# Patient Record
Sex: Female | Born: 1995 | Race: White | Hispanic: Yes | Marital: Single | Smoking: Former smoker
Health system: Southern US, Community
[De-identification: ages and names within clinical notes are randomized; demographics above are authoritative.]

## PROBLEM LIST (undated history)

## (undated) ENCOUNTER — Inpatient Hospital Stay (HOSPITAL_COMMUNITY): Payer: Self-pay

## (undated) DIAGNOSIS — O09299 Supervision of pregnancy with other poor reproductive or obstetric history, unspecified trimester: Secondary | ICD-10-CM

## (undated) DIAGNOSIS — R87629 Unspecified abnormal cytological findings in specimens from vagina: Secondary | ICD-10-CM

## (undated) DIAGNOSIS — Z789 Other specified health status: Secondary | ICD-10-CM

## (undated) DIAGNOSIS — Z87440 Personal history of urinary (tract) infections: Secondary | ICD-10-CM

## (undated) DIAGNOSIS — Z8759 Personal history of other complications of pregnancy, childbirth and the puerperium: Secondary | ICD-10-CM

## (undated) HISTORY — DX: Personal history of other complications of pregnancy, childbirth and the puerperium: Z87.440

## (undated) HISTORY — PX: OTHER SURGICAL HISTORY: SHX169

## (undated) HISTORY — DX: Unspecified abnormal cytological findings in specimens from vagina: R87.629

## (undated) HISTORY — DX: Personal history of other complications of pregnancy, childbirth and the puerperium: Z87.59

## (undated) HISTORY — DX: Supervision of pregnancy with other poor reproductive or obstetric history, unspecified trimester: O09.299

---
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
## 2015-02-12 NOTE — L&D Delivery Note (Signed)
Delivery Note At 0702  a viable female was delivered via  ;  SVD.  APGAR: 8,9, ; weight 7-11.8 Placenta status:spontaneous intact Cord 3V with the following complications with a PPH.  External uterine massage, Methergine.2 Im, 800mg  cyctotec, pitocin IV  Anesthesia:  Epidural Episiotomy:  None Lacerations:  R sulcus and 2nd vaginal Suture Repair: 2.0 3.0 vicryl Est. Blood Loss (mL):  1000 cc  Mom to postpartum.  Baby to Couplet care / Skin to Skin.  Paula Simmons 11/19/2015, 8:00 AM

## 2015-02-22 ENCOUNTER — Inpatient Hospital Stay (HOSPITAL_COMMUNITY): Payer: Medicaid Other

## 2015-02-22 ENCOUNTER — Encounter (HOSPITAL_COMMUNITY): Payer: Self-pay

## 2015-02-22 ENCOUNTER — Inpatient Hospital Stay (HOSPITAL_COMMUNITY)
Admission: AD | Admit: 2015-02-22 | Discharge: 2015-02-22 | Disposition: A | Discharge status: Home / Self Care | Payer: Medicaid Other | Source: Ambulatory Visit | Attending: Family Medicine | Admitting: Family Medicine

## 2015-02-22 DIAGNOSIS — O209 Hemorrhage in early pregnancy, unspecified: Secondary | ICD-10-CM | POA: Diagnosis not present

## 2015-02-22 DIAGNOSIS — O26851 Spotting complicating pregnancy, first trimester: Secondary | ICD-10-CM | POA: Diagnosis not present

## 2015-02-22 DIAGNOSIS — Z3A01 Less than 8 weeks gestation of pregnancy: Secondary | ICD-10-CM | POA: Insufficient documentation

## 2015-02-22 DIAGNOSIS — R102 Pelvic and perineal pain: Secondary | ICD-10-CM | POA: Diagnosis not present

## 2015-02-22 DIAGNOSIS — Z87891 Personal history of nicotine dependence: Secondary | ICD-10-CM | POA: Diagnosis not present

## 2015-02-22 DIAGNOSIS — N898 Other specified noninflammatory disorders of vagina: Secondary | ICD-10-CM | POA: Diagnosis not present

## 2015-02-22 DIAGNOSIS — O200 Threatened abortion: Secondary | ICD-10-CM

## 2015-02-22 DIAGNOSIS — Z3201 Encounter for pregnancy test, result positive: Secondary | ICD-10-CM | POA: Insufficient documentation

## 2015-02-22 LAB — CBC
HCT: 37.2 % (ref 36.0–46.0)
HEMOGLOBIN: 12.5 g/dL (ref 12.0–15.0)
MCH: 29.3 pg (ref 26.0–34.0)
MCHC: 33.6 g/dL (ref 30.0–36.0)
MCV: 87.1 fL (ref 78.0–100.0)
PLATELETS: 257 10*3/uL (ref 150–400)
RBC: 4.27 MIL/uL (ref 3.87–5.11)
RDW: 13.2 % (ref 11.5–15.5)
WBC: 7.3 10*3/uL (ref 4.0–10.5)

## 2015-02-22 LAB — URINALYSIS, ROUTINE W REFLEX MICROSCOPIC
Bilirubin Urine: NEGATIVE
GLUCOSE, UA: NEGATIVE mg/dL
KETONES UR: NEGATIVE mg/dL
LEUKOCYTES UA: NEGATIVE
Nitrite: NEGATIVE
PROTEIN: NEGATIVE mg/dL
Specific Gravity, Urine: 1.01 (ref 1.005–1.030)
pH: 7 (ref 5.0–8.0)

## 2015-02-22 LAB — WET PREP, GENITAL
CLUE CELLS WET PREP: NONE SEEN
SPERM: NONE SEEN
TRICH WET PREP: NONE SEEN
Yeast Wet Prep HPF POC: NONE SEEN

## 2015-02-22 LAB — URINE MICROSCOPIC-ADD ON: Bacteria, UA: NONE SEEN

## 2015-02-22 LAB — HCG, QUANTITATIVE, PREGNANCY: hCG, Beta Chain, Quant, S: 4353 m[IU]/mL — ABNORMAL HIGH (ref <5)

## 2015-02-22 LAB — POCT PREGNANCY, URINE: PREG TEST UR: POSITIVE — AB

## 2015-02-22 LAB — ABO/RH: ABO/RH(D): O POS

## 2015-02-22 NOTE — MAU Provider Note (Signed)
History     CSN: [id number]  Arrival date and time: 02/22/15 4098   First Provider Initiated Contact with Patient 02/22/15 2014      Chief Complaint  Patient presents with  . Possible Pregnancy  . Vaginal Bleeding   Vaginal Bleeding The patient's primary symptoms include pelvic pain and vaginal bleeding. This is a new problem. The current episode started today. The problem occurs constantly. The problem has been unchanged. The pain is moderate (6/10). The problem affects both sides. She is pregnant. Associated symptoms include abdominal pain. Pertinent negatives include no constipation, diarrhea, dysuria, fever, frequency, nausea, urgency or vomiting. The vaginal discharge was bloody. The vaginal bleeding is heavier than menses. She has not been passing clots. She has not been passing tissue. Nothing aggravates the symptoms. She has tried nothing for the symptoms. Sexual activity: reports intercourse within the past 24 hours.  She uses nothing for contraception. Her menstrual history has been regular (LMP" 12/30/14.).    History reviewed. No pertinent past medical history.  Past Surgical History  Procedure Laterality Date  . Arm surgery       History reviewed. No pertinent family history.  Social History  Substance Use Topics  . Smoking status: Former Games developer  . Smokeless tobacco: Never Used  . Alcohol Use: No    Allergies: No Known Allergies  Prescriptions prior to admission  Medication Sig Dispense Refill Last Dose  . acetaminophen (TYLENOL) 325 MG tablet Take 650 mg by mouth every 6 (six) hours as needed for moderate pain.   02/21/2015 at Unknown time  . Prenatal Vit-Fe Fumarate-FA (PRENATAL MULTIVITAMIN) TABS tablet Take 1 tablet by mouth daily at 12 noon.   02/22/2015 at Unknown time    Review of Systems  Constitutional: Negative for fever.  Gastrointestinal: Positive for abdominal pain. Negative for nausea, vomiting, diarrhea and constipation.  Genitourinary: Positive  for vaginal bleeding and pelvic pain. Negative for dysuria, urgency and frequency.   Physical Exam   Blood pressure 148/94, pulse 115, temperature 98.1 F (36.7 C), temperature source Oral, resp. rate 18, height 5\' 4"  (1.626 m), weight 62.959 kg (138 lb 12.8 oz), last menstrual period 12/30/2014.  Physical Exam  Nursing note and vitals reviewed. Constitutional: She is oriented to person, place, and time. She appears well-developed and well-nourished. No distress.  HENT:  Head: Normocephalic.  Cardiovascular: Normal rate.   Respiratory: Effort normal.  GI: Soft. There is no tenderness. There is no rebound.  Genitourinary:   External: no lesion Vagina: moderate amount of blood seen Cervix: pink, smooth, no CMT Uterus: NSSC Adnexa: NT   Neurological: She is alert and oriented to person, place, and time.  Skin: Skin is warm and dry.   Results for orders placed or performed during the hospital encounter of 02/22/15 (from the past 24 hour(s))  Urinalysis, Routine w reflex microscopic (not at [facility])     Status: Abnormal   Collection Time: 02/22/15  7:06 PM  Result Value Ref Range   Color, Urine YELLOW YELLOW   APPearance CLEAR CLEAR   Specific Gravity, Urine 1.010 1.005 - 1.030   pH 7.0 5.0 - 8.0   Glucose, UA NEGATIVE NEGATIVE mg/dL   Hgb urine dipstick LARGE (A) NEGATIVE   Bilirubin Urine NEGATIVE NEGATIVE   Ketones, ur NEGATIVE NEGATIVE mg/dL   Protein, ur NEGATIVE NEGATIVE mg/dL   Nitrite NEGATIVE NEGATIVE   Leukocytes, UA NEGATIVE NEGATIVE  Urine microscopic-add on     Status: Abnormal   Collection Time: 02/22/15  7:06  PM  Result Value Ref Range   Squamous Epithelial / LPF 0-5 (A) NONE SEEN   WBC, UA 0-5 0 - 5 WBC/hpf   RBC / HPF 6-30 0 - 5 RBC/hpf   Bacteria, UA NONE SEEN NONE SEEN  Pregnancy, urine POC     Status: Abnormal   Collection Time: 02/22/15  7:11 PM  Result Value Ref Range   Preg Test, Ur POSITIVE (A) NEGATIVE  CBC     Status: None   Collection Time:  02/22/15  8:29 PM  Result Value Ref Range   WBC 7.3 4.0 - 10.5 K/uL   RBC 4.27 3.87 - 5.11 MIL/uL   Hemoglobin 12.5 12.0 - 15.0 g/dL   HCT 21.337.2 08.636.0 - 57.846.0 %   MCV 87.1 78.0 - 100.0 fL   MCH 29.3 26.0 - 34.0 pg   MCHC 33.6 30.0 - 36.0 g/dL   RDW 46.913.2 62.911.5 - 52.815.5 %   Platelets 257 150 - 400 K/uL  ABO/Rh     Status: None (Preliminary result)   Collection Time: 02/22/15  8:29 PM  Result Value Ref Range   ABO/RH(D) O POS   hCG, quantitative, pregnancy     Status: Abnormal   Collection Time: 02/22/15  8:29 PM  Result Value Ref Range   hCG, Beta Chain, Quant, S 4353 (H) <5 mIU/mL  Wet prep, genital     Status: Abnormal   Collection Time: 02/22/15  8:36 PM  Result Value Ref Range   Yeast Wet Prep HPF POC NONE SEEN NONE SEEN   Trich, Wet Prep NONE SEEN NONE SEEN   Clue Cells Wet Prep HPF POC NONE SEEN NONE SEEN   WBC, Wet Prep HPF POC MODERATE (A) NONE SEEN   Sperm NONE SEEN    [place] Ob Comp Less 14 Wks  02/22/2015  CLINICAL DATA:  20 year old female with vaginal bleeding EXAM: OBSTETRIC <14 WK US AND TRANSVAGINAL OB US TECHNIQUE: Both transabdominal and transvaginal ultrasound examinations were performed for complete evaluation of the gestation as well as the maternal uterus, adnexal regions, and pelvic cul-de-sac. Transvaginal technique was performed to assess early pregnancy. COMPARISON:  None. FINDINGS: A single intrauterine gestational sac is noted containing a yolk sac and fetal pole. The gestational sac appears to be in the lower endometrium. No cardiac activity is identified at this time. Follow-up is recommended. CRL:  5  Mm   6 w   1 d                  US EDC: 10/18/2015 Subchorionic hemorrhage:  None visualized. Maternal uterus/adnexae: The ovaries appear unremarkable. No significant free fluid identified. IMPRESSION: Single intrauterine pregnancy with gestational sac positioned in the lower endometrium. The estimated gestational age based on crown-rump length of 5 mm is 6 weeks, 1 day.  No cardiac activity identified at time. Follow-up recommended. Electronically Signed   By: Elgie CollardArash  Radparvar M.D.   On: 02/22/2015 21:21   [place] Ob Transvaginal  02/22/2015  CLINICAL DATA:  20 year old female with vaginal bleeding EXAM: OBSTETRIC <14 WK US AND TRANSVAGINAL OB US TECHNIQUE: Both transabdominal and transvaginal ultrasound examinations were performed for complete evaluation of the gestation as well as the maternal uterus, adnexal regions, and pelvic cul-de-sac. Transvaginal technique was performed to assess early pregnancy. COMPARISON:  None. FINDINGS: A single intrauterine gestational sac is noted containing a yolk sac and fetal pole. The gestational sac appears to be in the lower endometrium. No cardiac activity is identified at this time. Follow-up  is recommended. CRL:  5  Mm   6 w   1 d                  [place] EDC: 10/18/2015 Subchorionic hemorrhage:  None visualized. Maternal uterus/adnexae: The ovaries appear unremarkable. No significant free fluid identified. IMPRESSION: Single intrauterine pregnancy with gestational sac positioned in the lower endometrium. The estimated gestational age based on crown-rump length of 5 mm is 6 weeks, 1 day. No cardiac activity identified at time. Follow-up recommended. Electronically Signed   By: Elgie Collard M.D.   On: 02/22/2015 21:21    MAU Course  Procedures  MDM   Assessment and Plan   1. Threatened miscarriage in early pregnancy   2. Vaginal bleeding in pregnancy, first trimester    DC home Comfort measures reviewed  1stTrimester precautions  Bleeding precautions RX: none  FU in 48 hours, no clinic appoitnment available for Friday afternoon- will have patient come to MAU   Follow-up Information    Follow up with [facility] [place] [facility]ERN[facility] ADMISS[facility].   Why:  FRIDAY 1/13 AFTER 5:00 PM    Contact information:   [address] [id number] mc [place] [address] [phone]       Tawnya Crook 02/22/2015, 9:49 PM

## 2015-02-22 NOTE — MAU Note (Signed)
Started bleeding this afternoon.  Some light tightening, cramping in lower abd. Has not been seen yet with this preg

## 2015-02-22 NOTE — Discharge Instructions (Signed)

## 2015-02-23 LAB — GC/CHLAMYDIA PROBE AMP (~~LOC~~) NOT AT ARMC
CHLAMYDIA, DNA PROBE: NEGATIVE
Neisseria Gonorrhea: NEGATIVE

## 2015-02-23 LAB — RPR: RPR Ser Ql: NONREACTIVE

## 2015-02-23 LAB — HIV ANTIBODY (ROUTINE TESTING W REFLEX): HIV Screen 4th Generation wRfx: NONREACTIVE

## 2015-02-24 ENCOUNTER — Encounter (HOSPITAL_COMMUNITY): Payer: Self-pay | Admitting: Student

## 2015-02-24 ENCOUNTER — Inpatient Hospital Stay (HOSPITAL_COMMUNITY)
Admission: AD | Admit: 2015-02-24 | Discharge: 2015-02-24 | Disposition: A | Discharge status: Home / Self Care | Payer: Medicaid Other | Source: Ambulatory Visit | Attending: Obstetrics & Gynecology | Admitting: Obstetrics & Gynecology

## 2015-02-24 DIAGNOSIS — Z87891 Personal history of nicotine dependence: Secondary | ICD-10-CM | POA: Diagnosis not present

## 2015-02-24 DIAGNOSIS — O039 Complete or unspecified spontaneous abortion without complication: Secondary | ICD-10-CM | POA: Diagnosis not present

## 2015-02-24 DIAGNOSIS — Z09 Encounter for follow-up examination after completed treatment for conditions other than malignant neoplasm: Secondary | ICD-10-CM | POA: Diagnosis present

## 2015-02-24 HISTORY — DX: Other specified health status: Z78.9

## 2015-02-24 LAB — HCG, QUANTITATIVE, PREGNANCY: hCG, Beta Chain, Quant, S: 835 m[IU]/mL — ABNORMAL HIGH (ref <5)

## 2015-02-24 NOTE — MAU Provider Note (Signed)
History   [id number]   Chief Complaint  Patient presents with  . Follow-up    HPI Carleene OverlieLorena Thielen is a 20 y.o. female G1P0 here for follow-up BHCG.  Upon review of the records patient was first seen on 1/11 for vaginal bleeding. BHCG on that day was 4353. Ultrasound showed SIUP in lower endometrium.  GC/CT and wet prep were collected.  Results were negative. Pt discharged home with threatened miscarriage info to f/u in 48 hrs for BHCG. Today, pt denies abdominal pain; reports continued vaginal bleeding, has to change pad 3 times per day. All other systems negative.   Patient's last menstrual period was 12/30/2014.  OB History  Gravida Para Term Preterm AB SAB TAB Ectopic Multiple Living  1             # Outcome Date GA Lbr Len/2nd Weight Sex Delivery Anes PTL Lv  1 Current               History reviewed. No pertinent past medical history.  No family history on file.  Social History   Social History  . Marital Status: Single    Spouse Name: N/A  . Number of Children: N/A  . Years of Education: N/A   Social History Main Topics  . Smoking status: Former Games developermoker  . Smokeless tobacco: Never Used  . Alcohol Use: No  . Drug Use: No  . Sexual Activity: Not Currently   Other Topics Concern  . Not on file   Social History Narrative  . No narrative on file    No Known Allergies  No current facility-administered medications on file prior to encounter.   Current Outpatient Prescriptions on File Prior to Encounter  Medication Sig Dispense Refill  . acetaminophen (TYLENOL) 325 MG tablet Take 650 mg by mouth every 6 (six) hours as needed for moderate pain.    . Prenatal Vit-Fe Fumarate-FA (PRENATAL MULTIVITAMIN) TABS tablet Take 1 tablet by mouth daily at 12 noon.       Physical Exam   Filed Vitals:   02/24/15 1725  BP: 117/72  Pulse: 92  Temp: 98.1 F (36.7 C)  TempSrc: Oral  Resp: 18    Physical Exam  Constitutional: She appears well-developed and  well-nourished. No distress.  Respiratory: Effort normal. No respiratory distress.  Musculoskeletal: Normal range of motion.  Skin: She is not diaphoretic.  Psychiatric: She has a normal mood and affect. Her behavior is normal. Judgment and thought content normal.    MAU Course  Procedures Component     Latest Ref Rng 02/22/2015 02/24/2015  HCG, Beta Chain, Quant, S     <5 mIU/mL 4353 (H) 835 (H)   MDM Moderate decrease in BHCG No worsening symptoms  Assessment and Plan  19 y.o. G1P0 at 5966w3d wks Pregnancy Follow-up BHCG 1. Miscarriage     P: Discharge home Pelvic rest Discussed reasons to return to MAU Message sent to clinic for f/u in 2 weeks Comfort pack given  Judeth HornErin Sameka Bagent, NP 02/24/2015 7:02 PM

## 2015-02-24 NOTE — MAU Note (Addendum)
Judeth HornERin Lawrence NP in Triage to discuss lab results and d/c plan with pt. Pt d/c home from triage

## 2015-02-24 NOTE — MAU Note (Addendum)
Changing a pad 3 times a day, no cramping since being seen in MAU on 02/22/15.  Patient has her first appointment next Thursday with [facility], has never been there before.

## 2015-02-24 NOTE — Discharge Instructions (Signed)
Return to care   If you have heavier bleeding that soaks through more that 2 pads per hour for an hour or more  If you bleed so much that you feel like you might pass out or you do pass out  If you have significant abdominal pain that is not improved with Tylenol   If you develop a fever > 100.5      Miscarriage A miscarriage is the sudden loss of an unborn baby (fetus) before the 20th week of pregnancy. Most miscarriages happen in the first 3 months of pregnancy. Sometimes, it happens before a woman even knows she is pregnant. A miscarriage is also called a "spontaneous miscarriage" or "early pregnancy loss." Having a miscarriage can be an emotional experience. Talk with your caregiver about any questions you may have about miscarrying, the grieving process, and your future pregnancy plans. CAUSES   Problems with the fetal chromosomes that make it impossible for the baby to develop normally. Problems with the baby's genes or chromosomes are most often the result of errors that occur, by chance, as the embryo divides and grows. The problems are not inherited from the parents.  Infection of the cervix or uterus.   Hormone problems.   Problems with the cervix, such as having an incompetent cervix. This is when the tissue in the cervix is not strong enough to hold the pregnancy.   Problems with the uterus, such as an abnormally shaped uterus, uterine fibroids, or congenital abnormalities.   Certain medical conditions.   Smoking, drinking alcohol, or taking illegal drugs.   Trauma.  Often, the cause of a miscarriage is unknown.  SYMPTOMS   Vaginal bleeding or spotting, with or without cramps or pain.  Pain or cramping in the abdomen or lower back.  Passing fluid, tissue, or blood clots from the vagina. DIAGNOSIS  Your caregiver will perform a physical exam. You may also have an ultrasound to confirm the miscarriage. Blood or urine tests may also be ordered. TREATMENT    Sometimes, treatment is not necessary if you naturally pass all the fetal tissue that was in the uterus. If some of the fetus or placenta remains in the body (incomplete miscarriage), tissue left behind may become infected and must be removed. Usually, a dilation and curettage (D and C) procedure is performed. During a D and C procedure, the cervix is widened (dilated) and any remaining fetal or placental tissue is gently removed from the uterus.  Antibiotic medicines are prescribed if there is an infection. Other medicines may be given to reduce the size of the uterus (contract) if there is a lot of bleeding.  If you have Rh negative blood and your baby was Rh positive, you will need a Rh immunoglobulin shot. This shot will protect any future baby from having Rh blood problems in future pregnancies. HOME CARE INSTRUCTIONS   Your caregiver may order bed rest or may allow you to continue light activity. Resume activity as directed by your caregiver.  Have someone help with home and family responsibilities during this time.   Keep track of the number of sanitary pads you use each day and how soaked (saturated) they are. Write down this information.   Do not use tampons. Do not douche or have sexual intercourse until approved by your caregiver.   Only take over-the-counter or prescription medicines for pain or discomfort as directed by your caregiver.   Do not take aspirin. Aspirin can cause bleeding.   Keep all follow-up appointments  with your caregiver.   If you or your partner have problems with grieving, talk to your caregiver or seek counseling to help cope with the pregnancy loss. Allow enough time to grieve before trying to get pregnant again.  SEEK IMMEDIATE MEDICAL CARE IF:  6. You have severe cramps or pain in your back or abdomen. 7. You have a fever. 8. You pass large blood clots (walnut-sized or larger) ortissue from your vagina. Save any tissue for your caregiver to  inspect.  9. Your bleeding increases.  10. You have a thick, bad-smelling vaginal discharge. 11. You become lightheaded, weak, or you faint.  12. You have chills.  MAKE SURE YOU:  Understand these instructions.  Will watch your condition.  Will get help right away if you are not doing well or get worse.   This information is not intended to replace advice given to you by your health care provider. Make sure you discuss any questions you have with your health care provider.   Document Released: 07/24/2000 Document Revised: 05/25/2012 Document Reviewed: 03/19/2011 Elsevier Interactive Patient Education [employer]2016 [employer]

## 2015-03-13 ENCOUNTER — Encounter: Payer: Medicaid Other | Admitting: Obstetrics and Gynecology

## 2015-04-12 LAB — OB RESULTS CONSOLE RPR: RPR: NONREACTIVE

## 2015-04-12 LAB — OB RESULTS CONSOLE HEPATITIS B SURFACE ANTIGEN: Hepatitis B Surface Ag: NEGATIVE

## 2015-04-12 LAB — OB RESULTS CONSOLE ANTIBODY SCREEN: Antibody Screen: NEGATIVE

## 2015-04-12 LAB — OB RESULTS CONSOLE GC/CHLAMYDIA
Chlamydia: NEGATIVE
GC PROBE AMP, GENITAL: NEGATIVE

## 2015-04-12 LAB — OB RESULTS CONSOLE ABO/RH: RH Type: POSITIVE

## 2015-04-12 LAB — OB RESULTS CONSOLE RUBELLA ANTIBODY, IGM: Rubella: IMMUNE

## 2015-04-12 LAB — OB RESULTS CONSOLE HIV ANTIBODY (ROUTINE TESTING): HIV: NONREACTIVE

## 2015-05-14 ENCOUNTER — Encounter (HOSPITAL_COMMUNITY): Payer: Self-pay | Admitting: *Deleted

## 2015-05-14 ENCOUNTER — Inpatient Hospital Stay (HOSPITAL_COMMUNITY)
Admission: AD | Admit: 2015-05-14 | Discharge: 2015-05-14 | Disposition: A | Discharge status: Home / Self Care | Payer: Medicaid Other | Source: Ambulatory Visit | Attending: Obstetrics and Gynecology | Admitting: Obstetrics and Gynecology

## 2015-05-14 DIAGNOSIS — O210 Mild hyperemesis gravidarum: Secondary | ICD-10-CM | POA: Insufficient documentation

## 2015-05-14 DIAGNOSIS — Z87891 Personal history of nicotine dependence: Secondary | ICD-10-CM | POA: Insufficient documentation

## 2015-05-14 DIAGNOSIS — O99512 Diseases of the respiratory system complicating pregnancy, second trimester: Secondary | ICD-10-CM | POA: Insufficient documentation

## 2015-05-14 DIAGNOSIS — O219 Vomiting of pregnancy, unspecified: Secondary | ICD-10-CM

## 2015-05-14 DIAGNOSIS — Z3A17 17 weeks gestation of pregnancy: Secondary | ICD-10-CM | POA: Insufficient documentation

## 2015-05-14 DIAGNOSIS — M791 Myalgia: Secondary | ICD-10-CM | POA: Insufficient documentation

## 2015-05-14 DIAGNOSIS — J069 Acute upper respiratory infection, unspecified: Secondary | ICD-10-CM | POA: Insufficient documentation

## 2015-05-14 DIAGNOSIS — M7918 Myalgia, other site: Secondary | ICD-10-CM

## 2015-05-14 LAB — URINALYSIS, ROUTINE W REFLEX MICROSCOPIC
GLUCOSE, UA: NEGATIVE mg/dL
Ketones, ur: >80 mg/dL — AB
Nitrite: NEGATIVE
PROTEIN: 30 mg/dL — AB
Specific Gravity, Urine: 1.025 (ref 1.005–1.030)
pH: 6 (ref 5.0–8.0)

## 2015-05-14 LAB — URINE MICROSCOPIC-ADD ON

## 2015-05-14 LAB — INFLUENZA PANEL BY PCR (TYPE A & B)
H1N1FLUPCR: NOT DETECTED
INFLBPCR: NEGATIVE
Influenza A By PCR: NEGATIVE

## 2015-05-14 MED ORDER — METOCLOPRAMIDE HCL 10 MG PO TABS: 10.0000 mg | ORAL_TABLET | Freq: Four times a day (QID) | ORAL | Status: DC

## 2015-05-14 MED ORDER — METOCLOPRAMIDE HCL 10 MG PO TABS
10.0000 mg | ORAL_TABLET | Freq: Once | ORAL | Status: AC
  Administered 2015-05-14: 10 mg via ORAL
  Filled 2015-05-14: qty 1

## 2015-05-14 MED ORDER — LACTATED RINGERS IV BOLUS (SEPSIS)
1000.0000 mL | Freq: Once | INTRAVENOUS | Status: AC
  Administered 2015-05-14: 1000 mL via INTRAVENOUS

## 2015-05-14 MED ORDER — M.V.I. ADULT IV INJ
Freq: Once | INTRAVENOUS | Status: AC
  Administered 2015-05-14: 17:00:00 via INTRAVENOUS
  Filled 2015-05-14: qty 1000

## 2015-05-14 NOTE — Discharge Instructions (Signed)
Upper Respiratory Infection, Adult °Most upper respiratory infections (URIs) are a viral infection of the air passages leading to the lungs. A URI affects the nose, throat, and upper air passages. The most common type of URI is nasopharyngitis and is typically referred to as "the common cold." °URIs run their course and usually go away on their own. Most of the time, a URI does not require medical attention, but sometimes a bacterial infection in the upper airways can follow a viral infection. This is called a secondary infection. Sinus and middle ear infections are common types of secondary upper respiratory infections. °Bacterial pneumonia can also complicate a URI. A URI can worsen asthma and chronic obstructive pulmonary disease (COPD). Sometimes, these complications can require emergency medical care and may be life threatening.  °CAUSES °Almost all URIs are caused by viruses. A virus is a type of germ and can spread from one person to another.  °RISKS FACTORS °You may be at risk for a URI if:  °· You smoke.   °· You have chronic heart or lung disease. °· You have a weakened defense (immune) system.   °· You are very young or very old.   °· You have nasal allergies or asthma. °· You work in crowded or poorly ventilated areas. °· You work in health care facilities or schools. °SIGNS AND SYMPTOMS  °Symptoms typically develop 2-3 days after you come in contact with a cold virus. Most viral URIs last 7-10 days. However, viral URIs from the influenza virus (flu virus) can last 14-18 days and are typically more severe. Symptoms may include:  °· Runny or stuffy (congested) nose.   °· Sneezing.   °· Cough.   °· Sore throat.   °· Headache.   °· Fatigue.   °· Fever.   °· Loss of appetite.   °· Pain in your forehead, behind your eyes, and over your cheekbones (sinus pain). °· Muscle aches.   °DIAGNOSIS  °Your health care provider may diagnose a URI by: °· Physical exam. °· Tests to check that your symptoms are not due to  another condition such as: °· Strep throat. °· Sinusitis. °· Pneumonia. °· Asthma. °TREATMENT  °A URI goes away on its own with time. It cannot be cured with medicines, but medicines may be prescribed or recommended to relieve symptoms. Medicines may help: °· Reduce your fever. °· Reduce your cough. °· Relieve nasal congestion. °HOME CARE INSTRUCTIONS  °· Take medicines only as directed by your health care provider.   °· Gargle warm saltwater or take cough drops to comfort your throat as directed by your health care provider. °· Use a warm mist humidifier or inhale steam from a shower to increase air moisture. This may make it easier to breathe. °· Drink enough fluid to keep your urine clear or pale yellow.   °· Eat soups and other clear broths and maintain good nutrition.   °· Rest as needed.   °· Return to work when your temperature has returned to normal or as your health care provider advises. You may need to stay home longer to avoid infecting others. You can also use a face mask and careful hand washing to prevent spread of the virus. °· Increase the usage of your inhaler if you have asthma.   °· Do not use any tobacco products, including cigarettes, chewing tobacco, or electronic cigarettes. If you need help quitting, ask your health care provider. °PREVENTION  °The best way to protect yourself from getting a cold is to practice good hygiene.  °· Avoid oral or hand contact with people with cold   symptoms.   °· Wash your hands often if contact occurs.   °There is no clear evidence that vitamin C, vitamin E, echinacea, or exercise reduces the chance of developing a cold. However, it is always recommended to get plenty of rest, exercise, and practice good nutrition.  °SEEK MEDICAL CARE IF:  °· You are getting worse rather than better.   °· Your symptoms are not controlled by medicine.   °· You have chills. °· You have worsening shortness of breath. °· You have brown or red mucus. °· You have yellow or brown nasal  discharge. °· You have pain in your face, especially when you bend forward. °· You have a fever. °· You have swollen neck glands. °· You have pain while swallowing. °· You have white areas in the back of your throat. °SEEK IMMEDIATE MEDICAL CARE IF:  °· You have severe or persistent: °¨ Headache. °¨ Ear pain. °¨ Sinus pain. °¨ Chest pain. °· You have chronic lung disease and any of the following: °¨ Wheezing. °¨ Prolonged cough. °¨ Coughing up blood. °¨ A change in your usual mucus. °· You have a stiff neck. °· You have changes in your: °¨ Vision. °¨ Hearing. °¨ Thinking. °¨ Mood. °MAKE SURE YOU:  °· Understand these instructions. °· Will watch your condition. °· Will get help right away if you are not doing well or get worse. °  °This information is not intended to replace advice given to you by your health care provider. Make sure you discuss any questions you have with your health care provider. °  °Document Released: 07/24/2000 Document Revised: 06/14/2014 Document Reviewed: 05/05/2013 °Elsevier Interactive Patient Education ©2016 [employer] ° °Musculoskeletal Pain °Musculoskeletal pain is muscle and boney aches and pains. These pains can occur in any part of the body. Your caregiver may treat you without knowing the cause of the pain. They may treat you if blood or urine tests, X-rays, and other tests were normal.  °CAUSES °There is often not a definite cause or reason for these pains. These pains may be caused by a type of germ (virus). The discomfort may also come from overuse. Overuse includes working out too hard when your body is not fit. Boney aches also come from weather changes. Bone is sensitive to atmospheric pressure changes. °HOME CARE INSTRUCTIONS  °· Ask when your test results will be ready. Make sure you get your test results. °· Only take over-the-counter or prescription medicines for pain, discomfort, or fever as directed by your caregiver. If you were given medications for your condition,  do not drive, operate machinery or power tools, or sign legal documents for 24 hours. Do not drink alcohol. Do not take sleeping pills or other medications that may interfere with treatment. °· Continue all activities unless the activities cause more pain. When the pain lessens, slowly resume normal activities. Gradually increase the intensity and duration of the activities or exercise. °· During periods of severe pain, bed rest may be helpful. Lay or sit in any position that is comfortable. °· Putting ice on the injured area. °¨ Put ice in a bag. °¨ Place a towel between your skin and the bag. °¨ Leave the ice on for 15 to 20 minutes, 3 to 4 times a day. °· Follow up with your caregiver for continued problems and no reason can be found for the pain. If the pain becomes worse or does not go away, it may be necessary to repeat tests or do additional testing. Your caregiver may need   to look further for a possible cause. °SEEK IMMEDIATE MEDICAL CARE IF: °· You have pain that is getting worse and is not relieved by medications. °· You develop chest pain that is associated with shortness or breath, sweating, feeling sick to your stomach (nauseous), or throw up (vomit). °· Your pain becomes localized to the abdomen. °· You develop any new symptoms that seem different or that concern you. °MAKE SURE YOU:  °· Understand these instructions. °· Will watch your condition. °· Will get help right away if you are not doing well or get worse. °  °This information is not intended to replace advice given to you by your health care provider. Make sure you discuss any questions you have with your health care provider. °  °Document Released: 01/28/2005 Document Revised: 04/22/2011 Document Reviewed: 10/02/2012 °Elsevier Interactive Patient Education ©2016 [employer] ° °

## 2015-05-14 NOTE — MAU Note (Signed)
Patient presents at [redacted] weeks gestation with c/o nasal congestion for the last couple of days but is now experiencing rib pain, back pain, weakness and inability to keep anything down. Denies bleeding but states she had an increased amount of discharge last night.

## 2015-05-14 NOTE — MAU Provider Note (Signed)
Chief Complaint: Nasal Congestion; Fatigue; Chest Pain; Back Pain; Nausea; Emesis; and Vaginal Discharge   First Provider Initiated Contact with Patient 05/14/15 1622      SUBJECTIVE HPI: Paula Simmons is a 20 y.o. G1P0 at [redacted]w[redacted]d by LMP who presents to maternity admissions reporting onset of nasal congestion, h/a, fatigue, body aches, cough, and nausea/vomiting yesterday. She reports that today her ribs, chest, and upper back hurt from coughing. She has taken Tylenol which helps with headache but not with other symptoms.  She is unable to keep down much food or fluids with her vomiting.  She reports a nephew was recently diagnosed with a respiratory infection that might be the flu but was not put on any medications.  She reports a mucous-like discharge that has increased with coughing but denies vaginal itching/burning or watery leakage. She denies vaginal bleeding, urinary symptoms, dizziness, fever/chills.     HPI  Past Medical History  Diagnosis Date  . Medical history non-contributory    Past Surgical History  Procedure Laterality Date  . Arm surgery     . No past surgeries     Social History   Social History  . Marital Status: Single    Spouse Name: N/A  . Number of Children: N/A  . Years of Education: N/A   Occupational History  . Not on file.   Social History Main Topics  . Smoking status: Former Games developer  . Smokeless tobacco: Never Used  . Alcohol Use: No  . Drug Use: No  . Sexual Activity: Not Currently   Other Topics Concern  . Not on file   Social History Narrative   No current facility-administered medications on file prior to encounter.   Current Outpatient Prescriptions on File Prior to Encounter  Medication Sig Dispense Refill  . acetaminophen (TYLENOL) 325 MG tablet Take 650 mg by mouth every 6 (six) hours as needed for moderate pain.    . Prenatal Vit-Fe Fumarate-FA (PRENATAL MULTIVITAMIN) TABS tablet Take 1 tablet by mouth daily at 12 noon.      No  Known Allergies  ROS:  Review of Systems  Constitutional: Positive for fatigue. Negative for fever and chills.  HENT: Positive for sinus pressure and sore throat.   Respiratory: Positive for cough. Negative for shortness of breath.   Cardiovascular: Positive for chest pain.  Gastrointestinal: Positive for nausea and vomiting.  Genitourinary: Positive for vaginal discharge and pelvic pain. Negative for dysuria, flank pain, vaginal bleeding, difficulty urinating and vaginal pain.  Musculoskeletal: Positive for myalgias.  Neurological: Positive for headaches. Negative for dizziness.  Psychiatric/Behavioral: Negative.      I have reviewed patient's Past Medical Hx, Surgical Hx, Family Hx, Social Hx, medications and allergies.   Physical Exam   Patient Vitals for the past 24 hrs:  BP Temp Temp src Pulse Resp Height Weight  05/14/15 1917 123/70 mmHg - - 109 18 - -  05/14/15 1550 130/70 mmHg 98.4 F (36.9 C) Oral 111 18  (1.626 m) 137 lb (62.143 kg)   Constitutional: Well-developed, well-nourished female in no acute distress.  HEART: normal rate, heart sounds, regular rhythm RESP: normal effort, lung sounds clear and equal bilaterally GI: Abd soft, non-tender. Pos BS x 4 MS: Extremities nontender, no edema, normal ROM Neurologic: Alert and oriented x 4.  GU: Neg CVAT.  PELVIC EXAM: Deferred  FHT 175 by doppler  LAB RESULTS Results for orders placed or performed during the hospital encounter of 05/14/15 (from the past 24 hour(s))  Urinalysis,  Routine w reflex microscopic (not at [facility])     Status: Abnormal   Collection Time: 05/14/15  3:35 PM  Result Value Ref Range   Color, Urine YELLOW YELLOW   APPearance CLEAR CLEAR   Specific Gravity, Urine 1.025 1.005 - 1.030   pH 6.0 5.0 - 8.0   Glucose, UA NEGATIVE NEGATIVE mg/dL   Hgb urine dipstick MODERATE (A) NEGATIVE   Bilirubin Urine SMALL (A) NEGATIVE   Ketones, ur >80 (A) NEGATIVE mg/dL   Protein, ur 30 (A) NEGATIVE mg/dL    Nitrite NEGATIVE NEGATIVE   Leukocytes, UA TRACE (A) NEGATIVE  Urine microscopic-add on     Status: Abnormal   Collection Time: 05/14/15  3:35 PM  Result Value Ref Range   Squamous Epithelial / LPF 6-30 (A) NONE SEEN   WBC, UA 0-5 0 - 5 WBC/hpf   RBC / HPF 0-5 0 - 5 RBC/hpf   Bacteria, UA RARE (A) NONE SEEN   Urine-Other MUCOUS PRESENT     --/--/O POS (01/11 2029)  IMAGING No results found.  MAU Management/MDM: Ordered labs and reviewed results. Pt afebrile with known exposure to URI, possibly influenza.  She is well-appearing with normal heart and lung sounds. D5LR x 1000 ml, LR x 1000 ml, Reglan 10 mg PO x 1 dose in MAU. Pt reports improvement in symptoms and tolerated PO food/fluids in MAU.  Consult Dr Su Hiltoberts.   Pt to f/u in office as scheduled.  List of safe OTC medications given, pt encouraged to treat URI symptoms PRN.  Reglan 10 mg PO Q 6 hours Rx sent to pharmacy.  Influenza swab results pending.  Pt stable at time of discharge.  ASSESSMENT 1. Acute upper respiratory infection   2. Musculoskeletal pain   3. Nausea and vomiting during pregnancy prior to [redacted] weeks gestation     PLAN Discharge home F/U in office or return to MAU if symptoms persist or worsen Otherwise, f/u in office as scheduled  Follow-up Information    Follow up with Purcell NailsOBERTS,ANGELA Y, MD.   Specialty:  Obstetrics and Gynecology   Why:  As scheduled, Return to MAU as needed for emergencies   Contact information:   [address] [place] [id number] [phone]       Sharen CounterLisa Leftwich-Kirby Certified Nurse-Midwife 05/14/2015  8:02 PM    r

## 2015-05-25 ENCOUNTER — Encounter (HOSPITAL_COMMUNITY): Payer: Self-pay | Admitting: *Deleted

## 2015-05-25 ENCOUNTER — Inpatient Hospital Stay (HOSPITAL_COMMUNITY)
Admission: AD | Admit: 2015-05-25 | Discharge: 2015-05-26 | Disposition: A | Discharge status: Home / Self Care | Payer: Medicaid Other | Source: Ambulatory Visit | Attending: Obstetrics and Gynecology | Admitting: Obstetrics and Gynecology

## 2015-05-25 DIAGNOSIS — R05 Cough: Secondary | ICD-10-CM | POA: Diagnosis not present

## 2015-05-25 DIAGNOSIS — Z3A13 13 weeks gestation of pregnancy: Secondary | ICD-10-CM | POA: Diagnosis not present

## 2015-05-25 DIAGNOSIS — B9620 Unspecified Escherichia coli [E. coli] as the cause of diseases classified elsewhere: Secondary | ICD-10-CM | POA: Diagnosis not present

## 2015-05-25 DIAGNOSIS — O2341 Unspecified infection of urinary tract in pregnancy, first trimester: Secondary | ICD-10-CM | POA: Insufficient documentation

## 2015-05-25 DIAGNOSIS — R053 Chronic cough: Secondary | ICD-10-CM

## 2015-05-25 DIAGNOSIS — O26891 Other specified pregnancy related conditions, first trimester: Secondary | ICD-10-CM | POA: Insufficient documentation

## 2015-05-25 DIAGNOSIS — R109 Unspecified abdominal pain: Secondary | ICD-10-CM

## 2015-05-25 DIAGNOSIS — M549 Dorsalgia, unspecified: Secondary | ICD-10-CM | POA: Diagnosis present

## 2015-05-25 DIAGNOSIS — O2342 Unspecified infection of urinary tract in pregnancy, second trimester: Secondary | ICD-10-CM

## 2015-05-25 LAB — CBC
HEMATOCRIT: 34.7 % — AB (ref 36.0–46.0)
HEMOGLOBIN: 12.4 g/dL (ref 12.0–15.0)
MCH: 30 pg (ref 26.0–34.0)
MCHC: 35.7 g/dL (ref 30.0–36.0)
MCV: 83.8 fL (ref 78.0–100.0)
Platelets: 245 10*3/uL (ref 150–400)
RBC: 4.14 MIL/uL (ref 3.87–5.11)
RDW: 12.8 % (ref 11.5–15.5)
WBC: 16.6 10*3/uL — AB (ref 4.0–10.5)

## 2015-05-25 LAB — URINALYSIS, ROUTINE W REFLEX MICROSCOPIC
BILIRUBIN URINE: NEGATIVE
GLUCOSE, UA: NEGATIVE mg/dL
Nitrite: NEGATIVE
PH: 5.5 (ref 5.0–8.0)
PROTEIN: 30 mg/dL — AB
Specific Gravity, Urine: 1.025 (ref 1.005–1.030)

## 2015-05-25 LAB — URINE MICROSCOPIC-ADD ON

## 2015-05-25 MED ORDER — M.V.I. ADULT IV INJ
INJECTION | Freq: Once | INTRAVENOUS | Status: DC
  Filled 2015-05-25: qty 10

## 2015-05-25 MED ORDER — MORPHINE SULFATE (PF) 4 MG/ML IV SOLN
2.0000 mg | Freq: Once | INTRAVENOUS | Status: AC
  Administered 2015-05-25: 2 mg via INTRAVENOUS
  Filled 2015-05-25: qty 1

## 2015-05-25 MED ORDER — LACTATED RINGERS IV SOLN: INTRAVENOUS | Status: DC

## 2015-05-25 MED ORDER — LACTATED RINGERS IV BOLUS (SEPSIS)
1000.0000 mL | Freq: Once | INTRAVENOUS | Status: AC
  Administered 2015-05-25: 1000 mL via INTRAVENOUS

## 2015-05-25 MED ORDER — METOCLOPRAMIDE HCL 10 MG PO TABS
10.0000 mg | ORAL_TABLET | Freq: Once | ORAL | Status: AC
  Administered 2015-05-25: 10 mg via ORAL
  Filled 2015-05-25: qty 1

## 2015-05-25 MED ORDER — DEXTROSE 5 % IN LACTATED RINGERS IV BOLUS
1000.0000 mL | Freq: Once | INTRAVENOUS | Status: AC
  Administered 2015-05-26: 1000 mL via INTRAVENOUS

## 2015-05-25 NOTE — MAU Note (Signed)
Pt presents complaining of right side flank pain. States she thinks it's a UTI. Having pain and burning when she urinates. Complains of sweating. Denies vaginal bleeding.

## 2015-05-26 MED ORDER — MORPHINE SULFATE (PF) 4 MG/ML IV SOLN
4.0000 mg | Freq: Once | INTRAVENOUS | Status: AC
  Administered 2015-05-26: 4 mg via INTRAVENOUS
  Filled 2015-05-26: qty 1

## 2015-05-26 MED ORDER — TESSALON PERLES 100 MG PO CAPS: 100.0000 mg | ORAL_CAPSULE | Freq: Three times a day (TID) | ORAL | Status: DC | PRN

## 2015-05-26 MED ORDER — ACETAMINOPHEN 500 MG PO TABS
1000.0000 mg | ORAL_TABLET | Freq: Once | ORAL | Status: AC
  Administered 2015-05-26: 1000 mg via ORAL
  Filled 2015-05-26: qty 2

## 2015-05-26 MED ORDER — ACETAMINOPHEN 500 MG PO TABS: 1000.0000 mg | ORAL_TABLET | Freq: Four times a day (QID) | ORAL | Status: DC | PRN

## 2015-05-26 MED ORDER — HYDROMORPHONE HCL 2 MG PO TABS: 2.0000 mg | ORAL_TABLET | ORAL | Status: DC | PRN

## 2015-05-26 MED ORDER — PERCOCET 5-325 MG PO TABS: 1.0000 | ORAL_TABLET | Freq: Four times a day (QID) | ORAL | Status: DC | PRN

## 2015-05-26 NOTE — MAU Provider Note (Signed)
History  Paula Simmons is a 20 yo G2P0010 @ 13.4 wks who presents unannounced to MAU w/ c/o inability to tolerate food/fluids and right sided back pain. Was seen by Faculty on 05/14/15 and rx'd Reglan. Reports taking as needed. Thinks she also may have a UTI due to pain and burning on urination. Denies vaginal bleeding or change in vaginal discharge. Also w/ c/o ongoing cough unrelieved w/ Robitussin.    Patient Active Problem List   Diagnosis Date Noted  . UTI (urinary tract infection) in pregnancy in second trimester 05/28/2015  . Cough, persistent 05/28/2015    Chief Complaint  Patient presents with  . Back Pain   HPI  As above  OB History    Gravida Para Term Preterm AB TAB SAB Ectopic Multiple Living   0      Past Medical History  Diagnosis Date  . Medical history non-contributory     Past Surgical History  Procedure Laterality Date  . Arm surgery       History reviewed. No pertinent family history.  Social History  Substance Use Topics  . Smoking status: Former Games developer  . Smokeless tobacco: Never Used  . Alcohol Use: No    Allergies: No Known Allergies  No prescriptions prior to admission    ROS  As per HPI Physical Exam   Recent Results (from the past 2160 hour(s))  Urinalysis, Routine w reflex microscopic (not at [facility])     Status: Abnormal   Collection Time: 05/14/15  3:35 PM  Result Value Ref Range   Color, Urine YELLOW YELLOW   APPearance CLEAR CLEAR   Specific Gravity, Urine 1.025 1.005 - 1.030   pH 6.0 5.0 - 8.0   Glucose, UA NEGATIVE NEGATIVE mg/dL   Hgb urine dipstick MODERATE (A) NEGATIVE   Bilirubin Urine SMALL (A) NEGATIVE   Ketones, ur >80 (A) NEGATIVE mg/dL   Protein, ur 30 (A) NEGATIVE mg/dL   Nitrite NEGATIVE NEGATIVE   Leukocytes, UA TRACE (A) NEGATIVE  Urine microscopic-add on     Status: Abnormal   Collection Time: 05/14/15  3:35 PM  Result Value Ref Range   Squamous Epithelial / LPF 6-30 (A) NONE SEEN   WBC, UA  0-5 0 - 5 WBC/hpf   RBC / HPF 0-5 0 - 5 RBC/hpf   Bacteria, UA RARE (A) NONE SEEN   Urine-Other MUCOUS PRESENT   Influenza panel by PCR (type A & B, H1N1)     Status: None   Collection Time: 05/14/15  5:03 PM  Result Value Ref Range   Influenza A By PCR NEGATIVE NEGATIVE   Influenza B By PCR NEGATIVE NEGATIVE   H1N1 flu by pcr NOT DETECTED NOT DETECTED    Comment:        The Xpert Flu assay (FDA approved for nasal aspirates or washes and nasopharyngeal swab specimens), is intended as an aid in the diagnosis of influenza and should not be used as a sole basis for treatment. Performed at [facility]   Urinalysis, Routine w reflex microscopic (not at [facility])     Status: Abnormal   Collection Time: 05/25/15  8:56 PM  Result Value Ref Range   Color, Urine YELLOW YELLOW   APPearance CLEAR CLEAR   Specific Gravity, Urine 1.025 1.005 - 1.030   pH 5.5 5.0 - 8.0   Glucose, UA NEGATIVE NEGATIVE mg/dL   Hgb urine dipstick MODERATE (A) NEGATIVE   Bilirubin Urine  NEGATIVE NEGATIVE   Ketones, ur >80 (A) NEGATIVE mg/dL   Protein, ur 30 (A) NEGATIVE mg/dL   Nitrite NEGATIVE NEGATIVE   Leukocytes, UA TRACE (A) NEGATIVE  Urine microscopic-add on     Status: Abnormal   Collection Time: 05/25/15  8:56 PM  Result Value Ref Range   Squamous Epithelial / LPF 0-5 (A) NONE SEEN   WBC, UA 0-5 0 - 5 WBC/hpf   RBC / HPF 0-5 0 - 5 RBC/hpf   Bacteria, UA FEW (A) NONE SEEN  OB Urine Culture     Status: Abnormal   Collection Time: 05/25/15  8:56 PM  Result Value Ref Range   Specimen Description URINE, RANDOM    Special Requests NONE    Culture (A)     >=100,000 COLONIES/mL ESCHERICHIA COLI NO GROUP B STREP (S.AGALACTIAE) ISOLATED Performed at [facility]    Report Status 05/28/2015 FINAL    Organism ID, Bacteria ESCHERICHIA COLI (A)       Susceptibility   Escherichia coli - MIC*    AMPICILLIN <=2 SENSITIVE Sensitive     CEFAZOLIN <=4 SENSITIVE Sensitive     CEFTRIAXONE <=1  SENSITIVE Sensitive     CIPROFLOXACIN <=0.25 SENSITIVE Sensitive     GENTAMICIN <=1 SENSITIVE Sensitive     IMIPENEM <=0.25 SENSITIVE Sensitive     NITROFURANTOIN <=16 SENSITIVE Sensitive     TRIMETH/SULFA <=20 SENSITIVE Sensitive     AMPICILLIN/SULBACTAM <=2 SENSITIVE Sensitive     PIP/TAZO <=4 SENSITIVE Sensitive     * >=100,000 COLONIES/mL ESCHERICHIA COLI  CBC     Status: Abnormal   Collection Time: 05/25/15 11:00 PM  Result Value Ref Range   WBC 16.6 (H) 4.0 - 10.5 K/uL   RBC 4.14 3.87 - 5.11 MIL/uL   Hemoglobin 12.4 12.0 - 15.0 g/dL   HCT 16.1 (L) 09.6 - 04.5 %   MCV 83.8 78.0 - 100.0 fL   MCH 30.0 26.0 - 34.0 pg   MCHC 35.7 30.0 - 36.0 g/dL   RDW 40.9 81.1 - 91.4 %   Platelets 245 150 - 400 K/uL   Physical Exam  Gen: NAD. Lungs: CTAB. CV: RRR w/o M/R/G. Back: Neg CVAT bilaterally. Abdomen: soft, gravid, NT, no rebound or guarding. Ext: WNL. Doptones: 184 bpm.   ED Course  D5LR bolus LR bolus Reglan 10 mg po Morphine IV Tylenol 1 gram  Assessment: URI vs lower UTI vs kidney stone or combination of the three. Persistent back pain. No CVAT. Presentation, labs and history d/w Dr. Normand Sloop. Given resolution of burning w/ urination w/ fluid administration, will await urine culture in lieu of prophylactic treatment. Discussed strict precautions for return.    Plan: Plan per Dr. Normand Sloop: 1) D/C home. 2) Office f/u early next week.  3) Dilaudid 2 mg tab -- 1 by mouth q 4 hrs prn severe pain #5 (NR) & Percocet 5/325 mg tab -- 1 by mouth q 6 hrs prn severe pain #30 (NR). In addition, return to MAU sooner if no improvement, or worsening sxs. SAB precautions reviewed, OOW x 1 wk, rest/ hydration, Tessalon Perles for cough, and continue Reglan prn. Will call with results of urine culture.    Sherre Scarlet CNM, MS 05/26/15, 1:15 AM  ADDENDUM:  Results reviewed -- urine culture = >100K e.coli. Contacted pt to review results and treat. Learned from FOB that pt is  hospitalized at a local hospital due to UTI and fever. FOB made decision to not drive back to [facility]  due to distance.   Sherre ScarletKimberly Mahad Newstrom, CNM 05/28/15, 3:46 PM

## 2015-05-26 NOTE — MAU Note (Signed)
Notified provider that patient's temp is 100.8. Provider said patient could still be discharged.

## 2015-05-28 DIAGNOSIS — O2342 Unspecified infection of urinary tract in pregnancy, second trimester: Secondary | ICD-10-CM

## 2015-05-28 DIAGNOSIS — R05 Cough: Secondary | ICD-10-CM

## 2015-05-28 DIAGNOSIS — R053 Chronic cough: Secondary | ICD-10-CM

## 2015-05-28 LAB — CULTURE, OB URINE: Culture: >=100000 — AB

## 2015-10-31 LAB — OB RESULTS CONSOLE GBS: STREP GROUP B AG: NEGATIVE

## 2015-11-05 ENCOUNTER — Encounter (HOSPITAL_COMMUNITY): Payer: Self-pay | Admitting: *Deleted

## 2015-11-05 ENCOUNTER — Telehealth: Payer: Self-pay | Admitting: Nurse Practitioner

## 2015-11-05 ENCOUNTER — Inpatient Hospital Stay (HOSPITAL_COMMUNITY)
Admission: AD | Admit: 2015-11-05 | Discharge: 2015-11-05 | Disposition: A | Discharge status: Home / Self Care | Payer: Medicaid Other | Source: Ambulatory Visit | Attending: Obstetrics and Gynecology | Admitting: Obstetrics and Gynecology

## 2015-11-05 DIAGNOSIS — Z87891 Personal history of nicotine dependence: Secondary | ICD-10-CM | POA: Insufficient documentation

## 2015-11-05 DIAGNOSIS — O99713 Diseases of the skin and subcutaneous tissue complicating pregnancy, third trimester: Secondary | ICD-10-CM | POA: Insufficient documentation

## 2015-11-05 DIAGNOSIS — Z3A36 36 weeks gestation of pregnancy: Secondary | ICD-10-CM | POA: Diagnosis not present

## 2015-11-05 DIAGNOSIS — L299 Pruritus, unspecified: Secondary | ICD-10-CM | POA: Diagnosis present

## 2015-11-05 DIAGNOSIS — Z79899 Other long term (current) drug therapy: Secondary | ICD-10-CM | POA: Diagnosis not present

## 2015-11-05 DIAGNOSIS — L259 Unspecified contact dermatitis, unspecified cause: Secondary | ICD-10-CM | POA: Diagnosis not present

## 2015-11-05 LAB — URINE MICROSCOPIC-ADD ON: RBC / HPF: NONE SEEN RBC/hpf (ref 0–5)

## 2015-11-05 LAB — COMPREHENSIVE METABOLIC PANEL
ALBUMIN: 2.8 g/dL — AB (ref 3.5–5.0)
ALT: 12 U/L — AB (ref 14–54)
AST: 15 U/L (ref 15–41)
Alkaline Phosphatase: 127 U/L — ABNORMAL HIGH (ref 38–126)
Anion gap: 8 (ref 5–15)
BUN: 10 mg/dL (ref 6–20)
CHLORIDE: 104 mmol/L (ref 101–111)
CO2: 22 mmol/L (ref 22–32)
CREATININE: 0.47 mg/dL (ref 0.44–1.00)
Calcium: 9 mg/dL (ref 8.9–10.3)
GFR calc Af Amer: >60 mL/min (ref >60)
GFR calc non Af Amer: >60 mL/min (ref >60)
GLUCOSE: 95 mg/dL (ref 65–99)
Potassium: 3.8 mmol/L (ref 3.5–5.1)
SODIUM: 134 mmol/L — AB (ref 135–145)
Total Bilirubin: 0.3 mg/dL (ref 0.3–1.2)
Total Protein: 6.8 g/dL (ref 6.5–8.1)

## 2015-11-05 LAB — URINALYSIS, ROUTINE W REFLEX MICROSCOPIC
BILIRUBIN URINE: NEGATIVE
GLUCOSE, UA: NEGATIVE mg/dL
Ketones, ur: NEGATIVE mg/dL
Nitrite: NEGATIVE
Protein, ur: NEGATIVE mg/dL
SPECIFIC GRAVITY, URINE: 1.015 (ref 1.005–1.030)
pH: 6 (ref 5.0–8.0)

## 2015-11-05 MED ORDER — HYDROCORTISONE 2.5 % EX CREA: TOPICAL_CREAM | Freq: Two times a day (BID) | CUTANEOUS | 0 refills | Status: DC

## 2015-11-05 NOTE — Discharge Instructions (Signed)
Take benadryl by the package directions to help with itching.   Keep your appointment in the office this week.

## 2015-11-05 NOTE — MAU Provider Note (Signed)
History     CSN: [id number]  Arrival date and time: 11/05/15 [id number]   First Provider Initiated Contact with Patient 11/05/15 [id number]      Chief Complaint  Patient presents with  . Pruritis   HPI Paula Simmons 20 y.o. 2947w6d  Comes to MAU today with increasing amounts of itching all over her body.  Has had itching on her abdomen with stretch marks for several weeks but now is having itching on lots of areas on her body - on her inner thighs, hips, spots on legs, across knuckles, on her left wrist, back of left arm near her arm pit.  Baby is moving well and she reports no leaking or vaginal bleeding.  Has not eaten since 1 am.   OB History    Gravida Para Term Preterm AB Living   2       1 0   SAB TAB Ectopic Multiple Live Births   1              Past Medical History:  Diagnosis Date  . Medical history non-contributory     Past Surgical History:  Procedure Laterality Date  . arm surgery       History reviewed. No pertinent family history.  Social History  Substance Use Topics  . Smoking status: Former Games developermoker  . Smokeless tobacco: Never Used  . Alcohol use No    Allergies: No Known Allergies  Prescriptions Prior to Admission  Medication Sig Dispense Refill Last Dose  . HYDROmorphone (DILAUDID) 2 MG tablet Take 1 tablet (2 mg total) by mouth every 4 (four) hours as needed for severe pain. 5 tablet 0   . metoCLOPramide (REGLAN) 10 MG tablet Take 1 tablet (10 mg total) by mouth every 6 (six) hours. 30 tablet 0   . PERCOCET 5-325 MG tablet Take 1 tablet by mouth every 6 (six) hours as needed for severe pain. 30 tablet 0   . Prenatal Vit-Fe Fumarate-FA (PRENATAL MULTIVITAMIN) TABS tablet Take 1 tablet by mouth daily at 12 noon.    05/13/2015 at Unknown time  . TESSALON PERLES 100 MG capsule Take 1 capsule (100 mg total) by mouth 3 (three) times daily as needed for cough. 20 capsule 0     Review of Systems  Constitutional: Negative for fever.  Gastrointestinal: Negative for  abdominal pain, nausea and vomiting.  Genitourinary:       No vaginal discharge. No vaginal bleeding. No dysuria.  Skin: Positive for itching and rash.       No itching on palms of hands or soles of feet   Physical Exam   Blood pressure 125/80, pulse 98, temperature 97.9 F (36.6 C), resp. rate 16, last menstrual period 12/30/2014, unknown if currently breastfeeding.  Physical Exam  Nursing note and vitals reviewed. Constitutional: She is oriented to person, place, and time. She appears well-developed and well-nourished.  HENT:  Head: Normocephalic.  Eyes: EOM are normal.  Neck: Neck supple.  GI: Soft. There is no tenderness.  FHT monitor strip - Baseline 140 with moderate variability and accels of 15x15 - reactive Category I tracing.  Irregular mild contractions.  Musculoskeletal: Normal range of motion.  Neurological: She is alert and oriented to person, place, and time.  Skin: Skin is warm and dry.  Abdomen has red raised stretch marks across almost entire lower abdomen which itch.  Also has red single papule or clusters of papules in areas on her body that are itching.  Rash on knuckles, lower  wrist, back of left arm near armpit, inner thighs, on lower legs.  No papules or itching on her face.  Psychiatric: She has a normal mood and affect.    MAU Course  Procedures Results for orders placed or performed during the hospital encounter of 11/05/15 (from the past 24 hour(s))  Urinalysis, Routine w reflex microscopic (not at [facility])     Status: Abnormal   Collection Time: 11/05/15  7:20 AM  Result Value Ref Range   Color, Urine YELLOW YELLOW   APPearance CLEAR CLEAR   Specific Gravity, Urine 1.015 1.005 - 1.030   pH 6.0 5.0 - 8.0   Glucose, UA NEGATIVE NEGATIVE mg/dL   Hgb urine dipstick TRACE (A) NEGATIVE   Bilirubin Urine NEGATIVE NEGATIVE   Ketones, ur NEGATIVE NEGATIVE mg/dL   Protein, ur NEGATIVE NEGATIVE mg/dL   Nitrite NEGATIVE NEGATIVE   Leukocytes, UA SMALL (A)  NEGATIVE  Urine microscopic-add on     Status: Abnormal   Collection Time: 11/05/15  7:20 AM  Result Value Ref Range   Squamous Epithelial / LPF 6-30 (A) NONE SEEN   WBC, UA TOO NUMEROUS TO COUNT 0 - 5 WBC/hpf   RBC / HPF NONE SEEN 0 - 5 RBC/hpf   Bacteria, UA RARE (A) NONE SEEN   Urine-Other MUCOUS PRESENT   Comprehensive metabolic panel     Status: Abnormal   Collection Time: 11/05/15  8:30 AM  Result Value Ref Range   Sodium 134 (L) 135 - 145 mmol/L   Potassium 3.8 3.5 - 5.1 mmol/L   Chloride 104 101 - 111 mmol/L   CO2 22 22 - 32 mmol/L   Glucose, Bld 95 65 - 99 mg/dL   BUN 10 6 - 20 mg/dL   Creatinine, Ser 0.45 0.44 - 1.00 mg/dL   Calcium 9.0 8.9 - 40.9 mg/dL   Total Protein 6.8 6.5 - 8.1 g/dL   Albumin 2.8 (L) 3.5 - 5.0 g/dL   AST 15 15 - 41 U/L   ALT 12 (L) 14 - 54 U/L   Alkaline Phosphatase 127 (H) 38 - 126 U/L   Total Bilirubin 0.3 0.3 - 1.2 mg/dL   GFR calc non Af Amer >60 >60 mL/min   GFR calc Af Amer >60 >60 mL/min   Anion gap 8 5 - 15    MDM Consult with Dr. Sallye Ober.  Itching is moderate to severe.  While it does not have the classic symptoms of cholestasis, will draw labs to review liver function and bile acids.  Currently will treat as contact dermatitis and client will be seen in the office on Tuesday.  Assessment and Plan  Contact Dermatitis at [redacted]w[redacted]d gestation  Plan Keep appointment in the office on Tuesday or call to be seen on Monday if condition is worsening. Eprescribed hydrocortisone cream to client's pharmacy Will use OTC benadryl by the package directions to help with itching   Terri L Burleson 11/05/2015, 8:08 AM    I agree with above findings and plan.  Patient also likely with itching secondary to stretching of skin with pregnancy advancement. I note normal total bile acids, total bili and LFTs.  Patient to follow up in the office. Dr. Sallye Ober.

## 2015-11-05 NOTE — MAU Note (Signed)
Started itching on Friday on her abd. Now is all over her body.  No other complaints, did not call her md

## 2015-11-05 NOTE — Telephone Encounter (Signed)
Needs prescription sent to a different pharmacy.  First pharmacy did not get the prescription electronically.  Will resend to her new pharmacy.  Rx - hydrocortisone cream.

## 2015-11-07 LAB — BILE ACIDS, TOTAL: Bile Acids Total: 9.2 umol/L (ref 4.7–24.5)

## 2015-11-15 ENCOUNTER — Encounter (HOSPITAL_COMMUNITY): Payer: Self-pay | Admitting: *Deleted

## 2015-11-15 ENCOUNTER — Telehealth (HOSPITAL_COMMUNITY): Payer: Self-pay | Admitting: *Deleted

## 2015-11-15 NOTE — Telephone Encounter (Signed)
Preadmission screen  

## 2015-11-18 ENCOUNTER — Encounter (HOSPITAL_COMMUNITY): Payer: Self-pay | Admitting: *Deleted

## 2015-11-18 ENCOUNTER — Inpatient Hospital Stay (HOSPITAL_COMMUNITY)
Admission: AD | Admit: 2015-11-18 | Discharge: 2015-11-21 | DRG: 774 | Disposition: A | Discharge status: Home / Self Care | Payer: Medicaid Other | Source: Ambulatory Visit | Attending: Obstetrics and Gynecology | Admitting: Obstetrics and Gynecology

## 2015-11-18 ENCOUNTER — Inpatient Hospital Stay (HOSPITAL_COMMUNITY): Payer: Medicaid Other | Admitting: Anesthesiology

## 2015-11-18 DIAGNOSIS — O2686 Pruritic urticarial papules and plaques of pregnancy (PUPPP): Secondary | ICD-10-CM | POA: Diagnosis present

## 2015-11-18 DIAGNOSIS — O9081 Anemia of the puerperium: Secondary | ICD-10-CM | POA: Diagnosis not present

## 2015-11-18 DIAGNOSIS — O411230 Chorioamnionitis, third trimester, not applicable or unspecified: Secondary | ICD-10-CM | POA: Diagnosis present

## 2015-11-18 DIAGNOSIS — D62 Acute posthemorrhagic anemia: Secondary | ICD-10-CM | POA: Diagnosis not present

## 2015-11-18 DIAGNOSIS — Z3A38 38 weeks gestation of pregnancy: Secondary | ICD-10-CM

## 2015-11-18 DIAGNOSIS — L309 Dermatitis, unspecified: Secondary | ICD-10-CM | POA: Diagnosis present

## 2015-11-18 DIAGNOSIS — O1404 Mild to moderate pre-eclampsia, complicating childbirth: Principal | ICD-10-CM | POA: Diagnosis present

## 2015-11-18 DIAGNOSIS — Z87891 Personal history of nicotine dependence: Secondary | ICD-10-CM | POA: Diagnosis not present

## 2015-11-18 DIAGNOSIS — O24429 Gestational diabetes mellitus in childbirth, unspecified control: Secondary | ICD-10-CM | POA: Diagnosis present

## 2015-11-18 DIAGNOSIS — O99713 Diseases of the skin and subcutaneous tissue complicating pregnancy, third trimester: Secondary | ICD-10-CM

## 2015-11-18 DIAGNOSIS — D649 Anemia, unspecified: Secondary | ICD-10-CM

## 2015-11-18 DIAGNOSIS — O1493 Unspecified pre-eclampsia, third trimester: Secondary | ICD-10-CM | POA: Diagnosis present

## 2015-11-18 DIAGNOSIS — O1400 Mild to moderate pre-eclampsia, unspecified trimester: Secondary | ICD-10-CM | POA: Diagnosis present

## 2015-11-18 LAB — COMPREHENSIVE METABOLIC PANEL
ALBUMIN: 2.8 g/dL — AB (ref 3.5–5.0)
ALT: 13 U/L — ABNORMAL LOW (ref 14–54)
ANION GAP: 7 (ref 5–15)
AST: 19 U/L (ref 15–41)
Alkaline Phosphatase: 127 U/L — ABNORMAL HIGH (ref 38–126)
BILIRUBIN TOTAL: 0.6 mg/dL (ref 0.3–1.2)
BUN: 12 mg/dL (ref 6–20)
CHLORIDE: 108 mmol/L (ref 101–111)
CO2: 22 mmol/L (ref 22–32)
Calcium: 9 mg/dL (ref 8.9–10.3)
Creatinine, Ser: 0.52 mg/dL (ref 0.44–1.00)
GFR calc Af Amer: >60 mL/min (ref >60)
Glucose, Bld: 87 mg/dL (ref 65–99)
POTASSIUM: 3.9 mmol/L (ref 3.5–5.1)
Sodium: 137 mmol/L (ref 135–145)
TOTAL PROTEIN: 6.9 g/dL (ref 6.5–8.1)

## 2015-11-18 LAB — CBC
HCT: 32.4 % — ABNORMAL LOW (ref 36.0–46.0)
HEMATOCRIT: 32.8 % — AB (ref 36.0–46.0)
Hemoglobin: 11.3 g/dL — ABNORMAL LOW (ref 12.0–15.0)
Hemoglobin: 11 g/dL — ABNORMAL LOW (ref 12.0–15.0)
MCH: 28 pg (ref 26.0–34.0)
MCH: 27.6 pg (ref 26.0–34.0)
MCHC: 34.5 g/dL (ref 30.0–36.0)
MCHC: 34 g/dL (ref 30.0–36.0)
MCV: 81.4 fL (ref 78.0–100.0)
MCV: 81.4 fL (ref 78.0–100.0)
PLATELETS: 250 10*3/uL (ref 150–400)
PLATELETS: 235 10*3/uL (ref 150–400)
RBC: 4.03 MIL/uL (ref 3.87–5.11)
RBC: 3.98 MIL/uL (ref 3.87–5.11)
RDW: 14.2 % (ref 11.5–15.5)
RDW: 14.5 % (ref 11.5–15.5)
WBC: 11.9 10*3/uL — AB (ref 4.0–10.5)
WBC: 12.9 10*3/uL — AB (ref 4.0–10.5)

## 2015-11-18 LAB — PROTEIN / CREATININE RATIO, URINE
CREATININE, URINE: 160 mg/dL
PROTEIN CREATININE RATIO: 0.84 mg/mg{creat} — AB (ref 0.00–0.15)
Total Protein, Urine: 134 mg/dL

## 2015-11-18 LAB — POCT FERN TEST: POCT FERN TEST: NEGATIVE

## 2015-11-18 MED ORDER — OXYTOCIN 40 UNITS IN LACTATED RINGERS INFUSION - SIMPLE MED
2.5000 [IU]/h | INTRAVENOUS | Status: DC
  Administered 2015-11-19: 2.5 [IU]/h via INTRAVENOUS
  Filled 2015-11-18: qty 1000

## 2015-11-18 MED ORDER — ONDANSETRON HCL 4 MG/2ML IJ SOLN
4.0000 mg | Freq: Four times a day (QID) | INTRAMUSCULAR | Status: DC | PRN
Start: 2015-11-18 — End: 2015-11-19
  Administered 2015-11-19: 4 mg via INTRAVENOUS
  Filled 2015-11-18: qty 2

## 2015-11-18 MED ORDER — PHENYLEPHRINE 40 MCG/ML (10ML) SYRINGE FOR IV PUSH (FOR BLOOD PRESSURE SUPPORT)
80.0000 ug | PREFILLED_SYRINGE | INTRAVENOUS | Status: DC | PRN
  Filled 2015-11-18: qty 5
  Filled 2015-11-18: qty 10

## 2015-11-18 MED ORDER — EPHEDRINE 5 MG/ML INJ
10.0000 mg | INTRAVENOUS | Status: DC | PRN
  Filled 2015-11-18: qty 4

## 2015-11-18 MED ORDER — LACTATED RINGERS IV SOLN
INTRAVENOUS | Status: DC
  Administered 2015-11-18: 125 mL/h via INTRAVENOUS

## 2015-11-18 MED ORDER — PHENYLEPHRINE 40 MCG/ML (10ML) SYRINGE FOR IV PUSH (FOR BLOOD PRESSURE SUPPORT)
80.0000 ug | PREFILLED_SYRINGE | INTRAVENOUS | Status: DC | PRN
  Filled 2015-11-18: qty 5

## 2015-11-18 MED ORDER — TERBUTALINE SULFATE 1 MG/ML IJ SOLN
0.2500 mg | Freq: Once | INTRAMUSCULAR | Status: DC | PRN
  Filled 2015-11-18: qty 1

## 2015-11-18 MED ORDER — LIDOCAINE HCL (PF) 1 % IJ SOLN
INTRAMUSCULAR | Status: DC | PRN
  Administered 2015-11-18 (×2): 5 mL

## 2015-11-18 MED ORDER — FENTANYL 2.5 MCG/ML BUPIVACAINE 1/10 % EPIDURAL INFUSION (WH - ANES)
14.0000 mL/h | INTRAMUSCULAR | Status: DC | PRN
  Administered 2015-11-18 (×2): 14 mL/h via EPIDURAL
  Filled 2015-11-18 (×2): qty 125

## 2015-11-18 MED ORDER — LACTATED RINGERS IV SOLN: 500.0000 mL | Freq: Once | INTRAVENOUS | Status: DC

## 2015-11-18 MED ORDER — FENTANYL CITRATE (PF) 100 MCG/2ML IJ SOLN
50.0000 ug | INTRAMUSCULAR | Status: DC | PRN
  Administered 2015-11-18: 100 ug via INTRAVENOUS
  Filled 2015-11-18: qty 2

## 2015-11-18 MED ORDER — OXYTOCIN 40 UNITS IN LACTATED RINGERS INFUSION - SIMPLE MED
1.0000 m[IU]/min | INTRAVENOUS | Status: DC
  Administered 2015-11-18: 1 m[IU]/min via INTRAVENOUS

## 2015-11-18 MED ORDER — FENTANYL 2.5 MCG/ML BUPIVACAINE 1/10 % EPIDURAL INFUSION (WH - ANES)
14.0000 mL/h | INTRAMUSCULAR | Status: DC | PRN
  Administered 2015-11-19: 14 mL/h via EPIDURAL

## 2015-11-18 MED ORDER — LIDOCAINE HCL (PF) 1 % IJ SOLN
30.0000 mL | INTRAMUSCULAR | Status: DC | PRN
  Administered 2015-11-19: 30 mL via SUBCUTANEOUS
  Filled 2015-11-18: qty 30

## 2015-11-18 MED ORDER — SOD CITRATE-CITRIC ACID 500-334 MG/5ML PO SOLN: 30.0000 mL | ORAL | Status: DC | PRN

## 2015-11-18 MED ORDER — LACTATED RINGERS IV SOLN
500.0000 mL | INTRAVENOUS | Status: DC | PRN
Start: 2015-11-18 — End: 2015-11-19
  Administered 2015-11-19: 999 mL via INTRAVENOUS

## 2015-11-18 MED ORDER — OXYCODONE-ACETAMINOPHEN 5-325 MG PO TABS: 1.0000 | ORAL_TABLET | ORAL | Status: DC | PRN

## 2015-11-18 MED ORDER — LABETALOL HCL 5 MG/ML IV SOLN
20.0000 mg | INTRAVENOUS | Status: DC | PRN
Start: 2015-11-18 — End: 2015-11-19

## 2015-11-18 MED ORDER — OXYCODONE-ACETAMINOPHEN 5-325 MG PO TABS: 2.0000 | ORAL_TABLET | ORAL | Status: DC | PRN

## 2015-11-18 MED ORDER — FLEET ENEMA 7-19 GM/118ML RE ENEM: 1.0000 | ENEMA | RECTAL | Status: DC | PRN

## 2015-11-18 MED ORDER — HYDRALAZINE HCL 20 MG/ML IJ SOLN: 10.0000 mg | Freq: Once | INTRAMUSCULAR | Status: DC | PRN

## 2015-11-18 MED ORDER — DIPHENHYDRAMINE HCL 50 MG/ML IJ SOLN: 12.5000 mg | INTRAMUSCULAR | Status: DC | PRN

## 2015-11-18 MED ORDER — ACETAMINOPHEN 325 MG PO TABS: 650.0000 mg | ORAL_TABLET | ORAL | Status: DC | PRN

## 2015-11-18 MED ORDER — OXYTOCIN BOLUS FROM INFUSION
500.0000 mL | Freq: Once | INTRAVENOUS | Status: AC
  Administered 2015-11-19: 500 mL via INTRAVENOUS

## 2015-11-18 NOTE — Anesthesia Preprocedure Evaluation (Signed)
Anesthesia Evaluation    Airway Mallampati: II  TM Distance: >3 FB Neck ROM: Full    Dental no notable dental hx.    Pulmonary former smoker,    Pulmonary exam normal        Cardiovascular hypertension (pre-eclampsia), Normal cardiovascular exam     Neuro/Psych    GI/Hepatic   Endo/Other    Renal/GU      Musculoskeletal   Abdominal   Peds  Hematology   Anesthesia Other Findings   Reproductive/Obstetrics (+) Pregnancy                             Anesthesia Physical Anesthesia Plan  ASA: III  Anesthesia Plan: Epidural   Post-op Pain Management:    Induction:   Airway Management Planned:   Additional Equipment:   Intra-op Plan:   Post-operative Plan:   Informed Consent:   Plan Discussed with:   Anesthesia Plan Comments:         Anesthesia Quick Evaluation

## 2015-11-18 NOTE — H&P (Signed)
LABOR ADMISSION HISTORY AND PHYSICAL  Paula Simmons is a 20 y.o. female G2P0010 with IUP at [redacted]w[redacted]d  presenting for possible leaking amnitoic fluid, increased b/p's found subsequently during MAU visit. She has an elevated PCR of .8 and other preeclampsia labs are normal. Pregnancy is complicated by PUPPS.  She reports +FMs, No LOF, no VB, no blurry vision, headaches or peripheral edema, and RUQ pain.  She plans on breastfeeding.    EFW: 7.5lbs  Prenatal History/Complications:  Past Medical History: Past Medical History:  Diagnosis Date  . Hx of pyelonephritis during pregnancy   . Medical history non-contributory     Past Surgical History: Past Surgical History:  Procedure Laterality Date  . arm surgery       Obstetrical History: OB History    Gravida Para Term Preterm AB Living   2       1 0   SAB TAB Ectopic Multiple Live Births   1              Social History: Social History   Social History  . Marital status: Single    Spouse name: N/A  . Number of children: N/A  . Years of education: N/A   Social History Main Topics  . Smoking status: Former Games developer  . Smokeless tobacco: Never Used  . Alcohol use No  . Drug use: No  . Sexual activity: Not Currently    Birth control/ protection: None   Other Topics Concern  . None   Social History Narrative  . None    Family History: History reviewed. No pertinent family history.  Allergies: No Known Allergies  Prescriptions Prior to Admission  Medication Sig Dispense Refill Last Dose  . diphenhydrAMINE (BENADRYL) 25 MG tablet Take 50 mg by mouth every 6 (six) hours as needed for itching or sleep.   Past Week at Unknown time  . Prenatal Vit-Fe Fumarate-FA (PRENATAL MULTIVITAMIN) TABS tablet Take 1 tablet by mouth daily.    11/17/2015 at Unknown time     Review of Systems   All systems reviewed and negative except as stated in HPI  Blood pressure 127/85, pulse 93, temperature 98.2 F (36.8 C), resp. rate 18, last  menstrual period 12/30/2014, unknown if currently breastfeeding. General appearance:  well nourished Lungs: clear to auscultation bilaterally Heart: regular rate and rhythm Abdomen: soft, non-tender; bowel sounds normal Pelvic: wnl Dilation: 3 Effacement (%): 50 Station: -3 Presentation: Undeterminable Exam by:: Sharen Counter CNM  Extremities: Homans sign is negative, no sign of DVT DTR's 3+ Presentation: VTX Fetal monitoring- Category 1 Uterine activity - Uc's q 2 minutes Dilation: 3 Effacement (%): 50 Station: -3 Exam by:: Sharen Counter CNM   Prenatal labs: ABO, Rh: O/Positive/-- (03/01 0000) Antibody: Rubella:  RPR: Negative  HBsAg: Negative (03/01 0000)  HIV: Non-reactive (03/01 0000)  GBS: Negative Genetic screening  wnl Anatomy US wnl  Prenatal Transfer Tool  Maternal Diabetes: no Genetic Screening: wnl Maternal Ultrasounds/Referrals: wnl Fetal Ultrasounds or other Referrals:  None Maternal Substance Abuse: no Significant Maternal Medications:  Benadryl  Significant Maternal Lab Results: PCR .8 Bile acids 9.6  Results for orders placed or performed during the hospital encounter of 11/18/15 (from the past 24 hour(s))  [facility] Time: 11/18/15 11:54 AM  Result Value Ref Range   POCT Fern Test Negative = intact amniotic membranes   CBC   Collection Time: 11/18/15 12:06 PM  Result Value Ref Range   WBC 11.9 (H) 4.0 - 10.5 K/uL  RBC 4.03 3.87 - 5.11 MIL/uL   Hemoglobin 11.3 (L) 12.0 - 15.0 g/dL   HCT 19.132.8 (L) 47.836.0 - 29.546.0 %   MCV 81.4 78.0 - 100.0 fL   MCH 28.0 26.0 - 34.0 pg   MCHC 34.5 30.0 - 36.0 g/dL   RDW 62.114.2 30.811.5 - 65.715.5 %   Platelets 250 150 - 400 K/uL  Comprehensive metabolic panel   Collection Time: 11/18/15 12:06 PM  Result Value Ref Range   Sodium 137 135 - 145 mmol/L   Potassium 3.9 3.5 - 5.1 mmol/L   Chloride 108 101 - 111 mmol/L   CO2 22 22 - 32 mmol/L   Glucose, Bld 87 65 - 99 mg/dL   BUN 12 6 - 20 mg/dL    Creatinine, Ser 8.460.52 0.44 - 1.00 mg/dL   Calcium 9.0 8.9 - 96.210.3 mg/dL   Total Protein 6.9 6.5 - 8.1 g/dL   Albumin 2.8 (L) 3.5 - 5.0 g/dL   AST 19 15 - 41 U/L   ALT 13 (L) 14 - 54 U/L   Alkaline Phosphatase 127 (H) 38 - 126 U/L   Total Bilirubin 0.6 0.3 - 1.2 mg/dL   GFR calc non Af Amer >60 >60 mL/min   GFR calc Af Amer >60 >60 mL/min   Anion gap 7 5 - 15  Protein / creatinine ratio, urine   Collection Time: 11/18/15 12:15 PM  Result Value Ref Range   Creatinine, Urine 160.00 mg/dL   Total Protein, Urine 134 mg/dL   Protein Creatinine Ratio 0.84 (H) 0.00 - 0.15 mg/mg[Cre]    Patient Active Problem List   Diagnosis Date Noted  . Mild preeclampsia 11/18/2015  . Pruritic urticarial papules and plaques of pregnancy, antepartum, third trimester 11/18/2015    Assessment: Paula Simmons is a 20 y.o. G2P0010 at 455w5d here for medical induction due to preeclampsia  Plan Pitocin induction Epidural when desired Anticipate vaginal delivery.  11/18/2015, 2:50 PM

## 2015-11-18 NOTE — Progress Notes (Signed)
Subjective: Pt requesting epidural. SROM at 2135 clear fluid.    Objective: BP 134/78   Pulse 97   Temp 98.7 F (37.1 C) (Oral)   Resp 18   Ht 5\' 3"  (1.6 m)   Wt 90.3 kg (199 lb)   LMP 12/30/2014   SpO2 97%   BMI 35.25 kg/m  No intake/output data recorded. No intake/output data recorded.  FHT: Category 1 UC:   regular, every 3-4 minutes SVE:   Dilation: 4.5 Effacement (%): 80 Station: -1 Exam by:: v. menash rn Pitocin at 7mu   Assessment:  Pt is a G2P0010 at 38.5 IUP in active labor Preclampsia without severe features Plan: Epidural Anticipate NSVD Kenney HousemanNancy Jean Caterina Racine CNM, MSN 11/18/2015, 10:41 PM

## 2015-11-18 NOTE — MAU Note (Signed)
Pt presents to MAU with complaints of leakage of fluid on and off since last night. Mild, irregular contractions

## 2015-11-18 NOTE — MAU Provider Note (Signed)
Chief Complaint:  No chief complaint on file.   None     HPI: Paula Simmons is a 20 y.o. G2P0010 at [redacted]w[redacted]d who presents to maternity admissions reporting leakage of clear fluid at home starting last night and continuing this morning. She reports an episode last night of leakage of clear fluid enough to soak her underwear then she went to the bathroom and had to urinate.  Then, this morning, more clear leakage is happening intermittently enough to soak underwear but not requiring a pad. She also reports cramping/contractions that are irregular but unchanged since leakage of fluid started. She has not tried any treatments, nothing makes her symptoms better or worse, and they are unchanged since onset.  She reports one elevated BP in the office at her last visit that improved when taken a second time.  She reports mild intermittent h/a, denies epigastric pain, or visual disturbances. She reports good fetal movement, denies vaginal bleeding, vaginal itching/burning, urinary symptoms, h/a, dizziness, n/v, or fever/chills.    HPI  Past Medical History: Past Medical History:  Diagnosis Date  . Hx of pyelonephritis during pregnancy   . Medical history non-contributory     Past obstetric history: OB History  Gravida Para Term Preterm AB Living  2       1 0  SAB TAB Ectopic Multiple Live Births  1            # Outcome Date GA Lbr Len/2nd Weight Sex Delivery Anes PTL Lv  2 Current           1 SAB               Past Surgical History: Past Surgical History:  Procedure Laterality Date  . arm surgery       Family History: History reviewed. No pertinent family history.  Social History: Social History  Substance Use Topics  . Smoking status: Former Games developer  . Smokeless tobacco: Never Used  . Alcohol use No    Allergies: No Known Allergies  Meds:  Prescriptions Prior to Admission  Medication Sig Dispense Refill Last Dose  . diphenhydrAMINE (BENADRYL) 25 MG tablet Take 50 mg by mouth  every 6 (six) hours as needed for itching or sleep.   Past Week at Unknown time  . Prenatal Vit-Fe Fumarate-FA (PRENATAL MULTIVITAMIN) TABS tablet Take 1 tablet by mouth daily.    11/17/2015 at Unknown time    ROS:  Review of Systems  Constitutional: Negative for chills, fatigue and fever.  Eyes: Negative for visual disturbance.  Respiratory: Negative for shortness of breath.   Cardiovascular: Negative for chest pain.  Gastrointestinal: Positive for abdominal pain. Negative for nausea and vomiting.  Genitourinary: Positive for vaginal discharge. Negative for difficulty urinating, dysuria, flank pain, pelvic pain, vaginal bleeding and vaginal pain.  Neurological: Negative for dizziness and headaches.  Psychiatric/Behavioral: Negative.      I have reviewed patient's Past Medical Hx, Surgical Hx, Family Hx, Social Hx, medications and allergies.   Physical Exam   Patient Vitals for the past 24 hrs:  BP Temp Pulse Resp  11/18/15 1204 127/85 - 93 -  11/18/15 1149 121/95 - 91 -  11/18/15 1133 130/78 - 96 -  11/18/15 1119 127/73 - 96 -  11/18/15 1104 131/87 - 96 -  11/18/15 1049 137/90 - 96 -  11/18/15 1047 137/99 98.2 F (36.8 C) 100 18   Constitutional: Well-developed, well-nourished female in no acute distress.  Cardiovascular: normal rate Respiratory: normal effort GI: Abd soft,  non-tender, gravid appropriate for gestational age.  MS: Extremities nontender, no edema, normal ROM Neurologic: Alert and oriented x 4.  GU: Neg CVAT.  PELVIC EXAM: Negative pooling of fluid with valsalva  Dilation: 3 Effacement (%): 50 Station: -3 Presentation: Undeterminable Exam by:: Sharen CounterLisa Leftwich-Kirby CNM  FHT:  Baseline 135, moderate variability, accelerations present, no decelerations Contractions: q 3-10 mins, irregular, mild to palpation   Labs: Results for orders placed or performed during the hospital encounter of 11/18/15 (from the past 24 hour(s))  Fern Test     Status: Normal    Collection Time: 11/18/15 11:54 AM  Result Value Ref Range   POCT Fern Test Negative = intact amniotic membranes   CBC     Status: Abnormal   Collection Time: 11/18/15 12:06 PM  Result Value Ref Range   WBC 11.9 (H) 4.0 - 10.5 K/uL   RBC 4.03 3.87 - 5.11 MIL/uL   Hemoglobin 11.3 (L) 12.0 - 15.0 g/dL   HCT 82.932.8 (L) 56.236.0 - 13.046.0 %   MCV 81.4 78.0 - 100.0 fL   MCH 28.0 26.0 - 34.0 pg   MCHC 34.5 30.0 - 36.0 g/dL   RDW 86.514.2 78.411.5 - 69.615.5 %   Platelets 250 150 - 400 K/uL  Comprehensive metabolic panel     Status: Abnormal   Collection Time: 11/18/15 12:06 PM  Result Value Ref Range   Sodium 137 135 - 145 mmol/L   Potassium 3.9 3.5 - 5.1 mmol/L   Chloride 108 101 - 111 mmol/L   CO2 22 22 - 32 mmol/L   Glucose, Bld 87 65 - 99 mg/dL   BUN 12 6 - 20 mg/dL   Creatinine, Ser 2.950.52 0.44 - 1.00 mg/dL   Calcium 9.0 8.9 - 28.410.3 mg/dL   Total Protein 6.9 6.5 - 8.1 g/dL   Albumin 2.8 (L) 3.5 - 5.0 g/dL   AST 19 15 - 41 U/L   ALT 13 (L) 14 - 54 U/L   Alkaline Phosphatase 127 (H) 38 - 126 U/L   Total Bilirubin 0.6 0.3 - 1.2 mg/dL   GFR calc non Af Amer >60 >60 mL/min   GFR calc Af Amer >60 >60 mL/min   Anion gap 7 5 - 15  Protein / creatinine ratio, urine     Status: None (Preliminary result)   Collection Time: 11/18/15 12:15 PM  Result Value Ref Range   Creatinine, Urine 160.00 mg/dL   Total Protein, Urine PENDING mg/dL   Protein Creatinine Ratio PENDING 0.00 - 0.15 mg/mg[Cre]   O/Positive/-- (03/01 0000)  Imaging:  No results found.  MAU Course/MDM: I have ordered labs and reviewed results.  No evidence of ROM or active labor today.  Preeclampsia labs wnl but pt does have neurologic symptom with h/a.   Consult Dr Charlotta Newtonzan.    Pt stable at time of transfer.  Assessment: Gestational HTN at term  [facility]   Sharen CounterLisa Leftwich-Kirby Certified Nurse-Midwife 11/18/2015 1:28 PM

## 2015-11-18 NOTE — Anesthesia Pain Management Evaluation Note (Signed)
  CRNA Pain Management Visit Note  Patient: Paula Simmons, 20 y.o., female  "Hello [facility] am a [facility] the [facility]. We have an anesthesia team available at all times to provide care throughout the hospital, including epidural management and anesthesia for C-section. I don't know your plan for the delivery whether it a natural birth, water birth, IV sedation, nitrous supplementation, doula or epidural, but we want to meet your pain goals."   1.Was your pain managed to your expectations on prior hospitalizations?   No prior hospitalizations  2.What is your expectation for pain management during this hospitalization?     Epidural  3.How can we help you reach that goal? epidural  Record the patient's initial score and the patient's pain goal.   Pain: 8  Pain Goal: 8 The [facility] wants you to be able to say your pain was always managed very well.  Antonette Hendricks 11/18/2015

## 2015-11-18 NOTE — Anesthesia Procedure Notes (Signed)
Epidural Patient location during procedure: OB Start time: 11/18/2015 10:15 PM End time: 11/18/2015 10:22 PM  Staffing Anesthesiologist: Bonita QuinGUIDETTI, Hawa Henly S Performed: anesthesiologist   Preanesthetic Checklist Completed: patient identified, site marked, surgical consent, pre-op evaluation, timeout performed, IV checked, risks and benefits discussed and monitors and equipment checked  Epidural Patient position: sitting Prep: site prepped and draped and DuraPrep Patient monitoring: continuous pulse ox and blood pressure Approach: midline Location: L4-L5 Injection technique: LOR saline  Needle:  Needle type: Tuohy  Needle gauge: 17 G Needle length: 9 cm and 9 Needle insertion depth: 6 cm Catheter type: closed end flexible Catheter size: 19 Gauge Catheter at skin depth: 11 cm Test dose: negative  Assessment Events: blood not aspirated, injection not painful, no injection resistance, negative IV test and no paresthesia

## 2015-11-19 LAB — CBC
HEMATOCRIT: 29.3 % — AB (ref 36.0–46.0)
HEMOGLOBIN: 10 g/dL — AB (ref 12.0–15.0)
MCH: 28.1 pg (ref 26.0–34.0)
MCHC: 34.1 g/dL (ref 30.0–36.0)
MCV: 82.3 fL (ref 78.0–100.0)
Platelets: 260 10*3/uL (ref 150–400)
RBC: 3.56 MIL/uL — ABNORMAL LOW (ref 3.87–5.11)
RDW: 14.5 % (ref 11.5–15.5)
WBC: 19 10*3/uL — ABNORMAL HIGH (ref 4.0–10.5)

## 2015-11-19 LAB — RPR: RPR Ser Ql: NONREACTIVE

## 2015-11-19 LAB — POSTPARTUM HEMORRHAGE PROTOCOL (BB NOTIFICATION)

## 2015-11-19 MED ORDER — COCONUT OIL OIL
1.0000 "application " | TOPICAL_OIL | Status: DC | PRN
  Administered 2015-11-20: 1 via TOPICAL
  Filled 2015-11-19: qty 120

## 2015-11-19 MED ORDER — MISOPROSTOL 200 MCG PO TABS
1000.0000 ug | ORAL_TABLET | Freq: Once | ORAL | Status: AC
  Administered 2015-11-19: 1000 ug via RECTAL

## 2015-11-19 MED ORDER — ONDANSETRON HCL 4 MG PO TABS
4.0000 mg | ORAL_TABLET | ORAL | Status: DC | PRN
Start: 2015-11-19 — End: 2015-11-21

## 2015-11-19 MED ORDER — METHYLERGONOVINE MALEATE 0.2 MG/ML IJ SOLN
INTRAMUSCULAR | Status: AC
  Administered 2015-11-19: 0.2 mg
  Filled 2015-11-19: qty 1

## 2015-11-19 MED ORDER — SIMETHICONE 80 MG PO CHEW: 80.0000 mg | CHEWABLE_TABLET | ORAL | Status: DC | PRN

## 2015-11-19 MED ORDER — TETANUS-DIPHTH-ACELL PERTUSSIS 5-2.5-18.5 LF-MCG/0.5 IM SUSP
0.5000 mL | Freq: Once | INTRAMUSCULAR | Status: AC
  Administered 2015-11-21: 0.5 mL via INTRAMUSCULAR
  Filled 2015-11-19: qty 0.5

## 2015-11-19 MED ORDER — METHYLERGONOVINE MALEATE 0.2 MG PO TABS: 0.2000 mg | ORAL_TABLET | ORAL | Status: DC | PRN

## 2015-11-19 MED ORDER — METHYLERGONOVINE MALEATE 0.2 MG/ML IJ SOLN: 0.2000 mg | INTRAMUSCULAR | Status: DC | PRN

## 2015-11-19 MED ORDER — DIPHENHYDRAMINE HCL 25 MG PO TABS
50.0000 mg | ORAL_TABLET | Freq: Four times a day (QID) | ORAL | Status: DC | PRN
  Filled 2015-11-19: qty 2

## 2015-11-19 MED ORDER — ACETAMINOPHEN 500 MG PO TABS
1000.0000 mg | ORAL_TABLET | Freq: Once | ORAL | Status: AC
  Administered 2015-11-19: 1000 mg via ORAL
  Filled 2015-11-19: qty 2

## 2015-11-19 MED ORDER — PRENATAL MULTIVITAMIN CH
1.0000 | ORAL_TABLET | Freq: Every day | ORAL | Status: DC
  Administered 2015-11-20: 1 via ORAL
  Filled 2015-11-19: qty 1

## 2015-11-19 MED ORDER — DIPHENHYDRAMINE HCL 25 MG PO CAPS: 25.0000 mg | ORAL_CAPSULE | Freq: Four times a day (QID) | ORAL | Status: DC | PRN

## 2015-11-19 MED ORDER — ONDANSETRON HCL 4 MG/2ML IJ SOLN: 4.0000 mg | INTRAMUSCULAR | Status: DC | PRN

## 2015-11-19 MED ORDER — IBUPROFEN 600 MG PO TABS
600.0000 mg | ORAL_TABLET | Freq: Four times a day (QID) | ORAL | Status: DC
  Administered 2015-11-19 – 2015-11-21 (×8): 600 mg via ORAL
  Filled 2015-11-19 (×8): qty 1

## 2015-11-19 MED ORDER — ZOLPIDEM TARTRATE 5 MG PO TABS: 5.0000 mg | ORAL_TABLET | Freq: Every evening | ORAL | Status: DC | PRN

## 2015-11-19 MED ORDER — MISOPROSTOL 200 MCG PO TABS
ORAL_TABLET | ORAL | Status: AC
  Administered 2015-11-19: 1000 ug via RECTAL
  Filled 2015-11-19: qty 5

## 2015-11-19 MED ORDER — ACETAMINOPHEN 325 MG PO TABS
650.0000 mg | ORAL_TABLET | ORAL | Status: DC | PRN
Start: 2015-11-19 — End: 2015-11-21

## 2015-11-19 MED ORDER — BENZOCAINE-MENTHOL 20-0.5 % EX AERO: 1.0000 "application " | INHALATION_SPRAY | CUTANEOUS | Status: DC | PRN

## 2015-11-19 MED ORDER — LACTATED RINGERS IV BOLUS (SEPSIS)
500.0000 mL | Freq: Once | INTRAVENOUS | Status: AC
  Administered 2015-11-19: 500 mL via INTRAVENOUS

## 2015-11-19 MED ORDER — WITCH HAZEL-GLYCERIN EX PADS: 1.0000 "application " | MEDICATED_PAD | CUTANEOUS | Status: DC | PRN

## 2015-11-19 MED ORDER — DIBUCAINE 1 % RE OINT: 1.0000 "application " | TOPICAL_OINTMENT | RECTAL | Status: DC | PRN

## 2015-11-19 MED ORDER — SODIUM CHLORIDE 0.9 % IV SOLN
3.0000 g | Freq: Four times a day (QID) | INTRAVENOUS | Status: AC
  Administered 2015-11-19 – 2015-11-20 (×4): 3 g via INTRAVENOUS
  Filled 2015-11-19 (×4): qty 3

## 2015-11-19 MED ORDER — SENNOSIDES-DOCUSATE SODIUM 8.6-50 MG PO TABS
2.0000 | ORAL_TABLET | ORAL | Status: DC
  Administered 2015-11-19 – 2015-11-21 (×2): 2 via ORAL
  Filled 2015-11-19 (×2): qty 2

## 2015-11-19 NOTE — Progress Notes (Signed)
Subjective: Comfortable with epidural.  Resting.   Objective: BP 110/72   Pulse 91   Temp 99.3 F (37.4 C) (Oral)   Resp 18   Ht 5\' 3"  (1.6 m)   Wt 90.3 kg (199 lb)   LMP 12/30/2014   SpO2 97%   BMI 35.25 kg/m  No intake/output data recorded. No intake/output data recorded.  FHT: Category 1  Variable decels with contractions with quick return to baseline UC:   regular, every 3 minutes SVE:   Dilation: 5 Effacement (%): 90 Station: -1 Exam by:: a. thacker rn  Pitocin at 10   Assessment:  Pt is a G2P0010 IUP in active labor Cat 1 strip SROM clear fluid Plan: Monitor progress Anticipate NSVD  Kenney HousemanNancy Jean Prothero CNM, MSN 11/19/2015, 1:50 AM

## 2015-11-19 NOTE — Anesthesia Postprocedure Evaluation (Signed)
Anesthesia Post Note  Patient: Paula OverlieLorena Zwicker  Procedure(s) Performed: * No procedures listed *  Patient location during evaluation: Mother Baby Anesthesia Type: Epidural Level of consciousness: awake and alert Pain management: pain level controlled Vital Signs Assessment: post-procedure vital signs reviewed and stable Respiratory status: spontaneous breathing and nonlabored ventilation Cardiovascular status: stable Postop Assessment: no headache, patient able to bend at knees, no backache, no signs of nausea or vomiting, epidural receding and adequate PO intake Anesthetic complications: no     Last Vitals:  Vitals:   11/19/15 1040 11/19/15 1222  BP: 131/78 104/75  Pulse: (!) 122 (!) 123  Resp: 18 20  Temp: (!) 38.3 C 37.7 C    Last Pain:  Vitals:   11/19/15 1040  TempSrc:   PainSc: 4    Pain Goal:                 Land O'LakesMalinova,Arcangel Minion Hristova

## 2015-11-19 NOTE — Progress Notes (Signed)
   11/19/15 1224  Activity and Safety  Activity Bedrest  Activity Assistance Assistive device (dizzy on commode, used ammonia to revive)  International aid/development workerAssistive Device Stedy  Comfort and Hygiene  Hygiene Peri care  Hygiene Assistance Complete assist

## 2015-11-19 NOTE — Lactation Note (Signed)
This note was copied from a baby's chart. Lactation Consultation Note  Patient Name: Paula Carleene OverlieLorena Okane RUEAV'WToday's Date: 11/19/2015 Reason for consult: Initial assessment  Visited with first time Mom for initial assessment, baby 8 hrs old.  Baby born at 6366w6d, weighing 7#11.8.  Delivery complicated with PPH, and maternal and infant fever following. Baby's fever WNL now.  Baby has been latched X2 for 5 minutes, and X1 for 12 minutes with help from her bedside nurse.  Baby awake and alert in crib, offered to assist with placing baby skin to skin to latch to the breast.  Manual breast expression demonstrated, colostrum easy to express.  Placed baby across chest in laid back position.  Baby would not root and open her mouth.  Expressed colostrum onto nipple, but baby not interested.  Tried football hold on right breast, but unable to stimulate baby to latch.  Manually expressed small amount of colostrum into spoon.  Baby became fussy when tried to feed this to her.  Used finger to feed these few drops to baby.  Left baby skin to skin with Mom.   Baby noted to be slightly jittery.  RN aware.   Brochure left in room.  Informed Mom of IP and OP lactation services available to her.  Encouraged her to call for assistance with latch when baby begins to cue to feed.    Consult Status Consult Status: Follow-up Date: 11/20/15 Follow-up type: In-patient    Paula Simmons, Paula Simmons 11/19/2015, 3:09 PM

## 2015-11-20 LAB — PREPARE RBC (CROSSMATCH)

## 2015-11-20 LAB — CBC
HEMATOCRIT: 18.6 % — AB (ref 36.0–46.0)
Hemoglobin: 6.5 g/dL — CL (ref 12.0–15.0)
MCH: 28.5 pg (ref 26.0–34.0)
MCHC: 34.9 g/dL (ref 30.0–36.0)
MCV: 81.6 fL (ref 78.0–100.0)
PLATELETS: 181 10*3/uL (ref 150–400)
RBC: 2.28 MIL/uL — ABNORMAL LOW (ref 3.87–5.11)
RDW: 15.1 % (ref 11.5–15.5)
WBC: 13.8 10*3/uL — AB (ref 4.0–10.5)

## 2015-11-20 LAB — GLUCOSE, CAPILLARY: Glucose-Capillary: 117 mg/dL — ABNORMAL HIGH (ref 65–99)

## 2015-11-20 MED ORDER — SODIUM CHLORIDE 0.9 % IV SOLN
Freq: Once | INTRAVENOUS | Status: AC
  Administered 2015-11-20: 10 mL/h via INTRAVENOUS

## 2015-11-20 MED ORDER — FERROUS SULFATE 325 (65 FE) MG PO TABS
325.0000 mg | ORAL_TABLET | Freq: Two times a day (BID) | ORAL | Status: DC
  Administered 2015-11-20 – 2015-11-21 (×2): 325 mg via ORAL
  Filled 2015-11-20 (×2): qty 1

## 2015-11-20 MED ORDER — ACETAMINOPHEN 325 MG PO TABS
650.0000 mg | ORAL_TABLET | Freq: Once | ORAL | Status: AC
  Administered 2015-11-20: 650 mg via ORAL
  Filled 2015-11-20: qty 2

## 2015-11-20 MED ORDER — DIPHENHYDRAMINE HCL 25 MG PO CAPS
25.0000 mg | ORAL_CAPSULE | Freq: Once | ORAL | Status: AC
  Administered 2015-11-20: 25 mg via ORAL
  Filled 2015-11-20: qty 1

## 2015-11-20 NOTE — Plan of Care (Signed)
Problem: Nutritional: Goal: Dietary intake will improve Outcome: Completed/Met Date Met: 11/20/15 Tolerated regular diet with good appetite.

## 2015-11-20 NOTE — Progress Notes (Signed)
No transfusion reaction IV infusion rate is increased to 157ml/hr.

## 2015-11-20 NOTE — Progress Notes (Signed)
The tranfusion official start was @1920 , when blood started to infuse at IV site, rate 120 ml/hr. I stayed with the pt for the first 15min.   At 1935 No transfusion reaction, VS taken and recorded.

## 2015-11-20 NOTE — Progress Notes (Signed)
Post Partum Day 1 Subjective: tolerating PO and Pt c/o headache and dizziness with standing  Objective: Blood pressure 117/66, pulse (!) 109, temperature 98.3 F (36.8 C), temperature source Oral, resp. rate 18, height 5\' 3"  (1.6 m), weight 199 lb (90.3 kg), last menstrual period 12/30/2014, SpO2 100 %, unknown if currently breastfeeding.  Physical Exam:  General: alert and cooperative Lochia: appropriate Uterine Fundus: firm Incision: na DVT Evaluation: No evidence of DVT seen on physical exam. CV increase rate and rhythm Lungs CTA B  Recent Labs  11/19/15 0749 11/20/15 0539  HGB 10.0* 6.5*  HCT 29.3* 18.6*    Assessment/Plan: Plan for discharge tomorrow  Pt with symptomatic anemia will transfuse two units and place on iron.  Check CBC in the morning Preeclampsia without severe features.  [facility] are low [facility] Ch[facility]  Pt afebrile.  Will continue unasyn   LOS: 2 days   Paula Simmons A 11/20/2015, 2:03 PM

## 2015-11-20 NOTE — Progress Notes (Signed)
UR chart review completed.  

## 2015-11-20 NOTE — Lactation Note (Signed)
This note was copied from a baby's chart. Lactation Consultation Note  Patient Name: Paula Simmons ZOXWR'UToday's Date: 11/20/2015 Reason for consult: Follow-up assessment Follow up visit made.  Baby is currently feeding well in cradle hold.  Reviewed waking techniques and breast massage during feeding.  Mom states left nipple is sore.  Reviewed importance of obtaining a deep latch and holding baby close.  She will ask her nurse for coconut oil to use on nipples.  Encouraged to call with concerns/assist prn.  Maternal Data    Feeding Feeding Type: Breast Fed  LATCH Score/Interventions Latch: Grasps breast easily, tongue down, lips flanged, rhythmical sucking.  Audible Swallowing: A few with stimulation  Type of Nipple: Everted at rest and after stimulation  Comfort (Breast/Nipple): Soft / non-tender     Hold (Positioning): No assistance needed to correctly position infant at breast.  LATCH Score: 9  Lactation Tools Discussed/Used     Consult Status Consult Status: Follow-up Date: 11/21/15 Follow-up type: In-patient    Huston FoleyMOULDEN, Rashanda Magloire S 11/20/2015, 2:20 PM

## 2015-11-20 NOTE — Progress Notes (Signed)
CRITICAL VALUE ALERT  Critical value received:   Hgb = 6.5  Date of notification:  *11/20/15  Time of notification:  *0650  Critical value read back:Yes.    Nurse who received alert:  Iona Hansenmlatham Rn  MD notified (1st page):  Dr. Myna HidalgoJennifer Ozan  Time of first page:  0700  MD notified (2nd page):  Time of second page:  Responding MD: Dr. Myna HidalgoJennifer Ozan  Time MD responded:  0700

## 2015-11-20 NOTE — Plan of Care (Signed)
Problem: Urinary Elimination: Goal: Ability to reestablish a normal urinary elimination pattern will improve Outcome: Completed/Met Date Met: 11/20/15 Voiding without any difficulty.

## 2015-11-21 ENCOUNTER — Encounter: Payer: Self-pay | Admitting: Obstetrics and Gynecology

## 2015-11-21 ENCOUNTER — Inpatient Hospital Stay (HOSPITAL_COMMUNITY): Admission: RE | Admit: 2015-11-21 | Payer: Medicaid Other | Source: Ambulatory Visit

## 2015-11-21 DIAGNOSIS — D649 Anemia, unspecified: Secondary | ICD-10-CM

## 2015-11-21 LAB — CBC WITH DIFFERENTIAL/PLATELET
BASOS ABS: 0 10*3/uL (ref 0.0–0.1)
BASOS PCT: 0 %
EOS ABS: 1.4 10*3/uL — AB (ref 0.0–0.7)
Eosinophils Relative: 8 %
HCT: 28.4 % — ABNORMAL LOW (ref 36.0–46.0)
HEMOGLOBIN: 9.8 g/dL — AB (ref 12.0–15.0)
Lymphocytes Relative: 17 %
Lymphs Abs: 2.9 10*3/uL (ref 0.7–4.0)
MCH: 28.4 pg (ref 26.0–34.0)
MCHC: 34.5 g/dL (ref 30.0–36.0)
MCV: 82.3 fL (ref 78.0–100.0)
Monocytes Absolute: 1 10*3/uL (ref 0.1–1.0)
Monocytes Relative: 6 %
Neutro Abs: 12 10*3/uL — ABNORMAL HIGH (ref 1.7–7.7)
Neutrophils Relative %: 69 %
Platelets: 242 10*3/uL (ref 150–400)
RBC: 3.45 MIL/uL — AB (ref 3.87–5.11)
RDW: 15.2 % (ref 11.5–15.5)
WBC: 17.3 10*3/uL — ABNORMAL HIGH (ref 4.0–10.5)

## 2015-11-21 LAB — TYPE AND SCREEN
ABO/RH(D): O POS
Antibody Screen: NEGATIVE
Unit division: 0
Unit division: 0

## 2015-11-21 LAB — GLUCOSE, CAPILLARY: GLUCOSE-CAPILLARY: 94 mg/dL (ref 65–99)

## 2015-11-21 MED ORDER — IBUPROFEN 600 MG PO TABS: 600.0000 mg | ORAL_TABLET | Freq: Four times a day (QID) | ORAL | 2 refills | Status: DC | PRN

## 2015-11-21 MED ORDER — FERROUS SULFATE 325 (65 FE) MG PO TABS: 325.0000 mg | ORAL_TABLET | Freq: Two times a day (BID) | ORAL | 3 refills | Status: AC

## 2015-11-21 NOTE — Lactation Note (Signed)
This note was copied from a baby's chart. Lactation Consultation Note  Patient Name: Paula [name] ZOXWR'UToday's Date: 11/21/2015 Reason for consult: Follow-up assessment   P1 mom had baby on right breast when LC entered room.  Baby was latched shallow and LC assisted mom in helping baby obtain deeper latch.  Baby fell asleep so LC suggest unwrapping baby to place STS.  Baby woke and was cueing so LC assisted mom in getting baby to Left breast in football hold.  Mom reports being sore on that side; compression stripe noted and LC instructed mom to hand express EBM to nipple before and after feed.  LC observed mom doing this. Good suck swallow pattern observed during feed.  Baby was still nursing well on left side when LC left room.   LC reviewed with mom and dad OP services and BF support groups offered after DC.  Also reviewed with mom cluster feeding and engorgement care.       Maternal Data    Feeding Feeding Type: Breast Fed Length of feed:  (lc observed first 15 min of feed)  LATCH Score/Interventions Latch: Grasps breast easily, tongue down, lips flanged, rhythmical sucking. Intervention(s): Skin to skin;Waking techniques;Teach feeding cues Intervention(s): Adjust position;Assist with latch;Breast massage;Breast compression  Audible Swallowing: A few with stimulation Intervention(s): Hand expression;Skin to skin  Type of Nipple: Everted at rest and after stimulation  Comfort (Breast/Nipple): Filling, red/small blisters or bruises, mild/mod discomfort (comp. stripe on L nip. EBM to nipple)  Problem noted: Cracked, bleeding, blisters, bruises Interventions (Mild/moderate discomfort): Hand massage;Hand expression  Hold (Positioning): Assistance needed to correctly position infant at breast and maintain latch. Intervention(s): Breastfeeding basics reviewed;Support Pillows;Skin to skin;Position options (changed to football hold from cross cradle)  LATCH Score: 7  Lactation  Tools Discussed/Used     Consult Status Consult Status: Complete Date: 11/21/15 Follow-up type: In-patient    Maryruth HancockKelly Suzanne [facility] 11/21/2015, 9:31 AM

## 2015-11-21 NOTE — Discharge Summary (Signed)
[facility]   Patient Name:   Paula Simmons DOB:     Jun 13, 1995 MRN:     [id number]  Date of Admission:   11/18/2015 Date of Discharge:  11/21/2015  Admitting diagnosis:    39 WEEKS WATER BROKE Principal Problem:   Vaginal delivery Active Problems:   Postpartum hemorrhage   Second-degree perineal laceration, with delivery   Severe anemia  Term Pregnancy Delivered, Preeclampsia (mild) and GDM A2    Discharge diagnosis:    39 WEEKS WATER BROKE Principal Problem:   Vaginal delivery Active Problems:   Postpartum hemorrhage   Second-degree perineal laceration, with delivery   Severe anemia  Term Pregnancy Delivered, Preeclampsia (mild) and GDM A2, postpartum fever                                                                     Post partum procedures: blood transfusion  Type of Delivery:  SVB  Delivering Provider: Kenney HousemanPROTHERO, NANCY JEAN   Date of Delivery:  11/19/15  Newborn Data:    Live born female  Birth Weight: 7 lbs 11.8 oz (3510 g) APGARS: 8, 9  Baby's Name:              Mia Baby Feeding:   Breast Disposition:   home with mother  Complications:   Intrauterine Inflammation or infection (Chorioamniotis) and Hemorrhage>103500mL  Hospital course:      Onset of Labor With Vaginal Delivery     20 y.o. G2P0010 at 2751w6d was admitted in Latent Labor and for mild preE on 11/18/2015. Patient had a complicated labor course as follows:  Membrane Rupture Time/Date: 9:35 PM ,11/18/2015  Intrapartum Procedures: Episiotomy: None [1]                                         Lacerations:  Sulcus [9];2nd degree [3]  Patient had a delivery of a Viable infant. 11/19/2015  Information for the patient's newborn:  Paula Simmons, Girl Jessabelle [[id number]][[id number]]  Delivery Method: Vag-Spont    Patient's postpartum course was c/b Tmax of 102.2 and EBL 1000 ml. Her BPs were low in the postpartum period likely due to blood loss (was admitted due to preE w/o severe  features). She received Unasyn 3 grams q 6hrs IV x 24 hours and 2 units of PRBCS on postpartum day 1 due to symptomatic anemia (dizziness) - Hbg 6.5. Her fasting and postprandial BGs are within normal range. She is ambulating, tolerating a regular diet, passing flatus, and urinating well. She is feeling much better after blood transfusion. Post transfusion hbg is 9.8. Patient is discharged home in stable condition on 11/21/15. She is to f/u in the office in 1 week for BP check per Dr. Estanislado Pandyivard.    Physical Exam:   Vitals:   11/20/15 2200 11/20/15 2215 11/21/15 0030 11/21/15 0610  BP: 120/67 120/71 115/67 117/74  Pulse: (!) 120 (!) 112 99 (!) 102  Resp: 20 20 18 18   Temp: 97.8 F (36.6 C) 97.8 F (36.6 C) 97.9 F (36.6 C) 98.4 F (36.9 C)  TempSrc: Oral Oral Oral Oral  SpO2: 99% 100% 99%   Weight:  Height:       General: alert, cooperative and no distress Lochia: appropriate Uterine Fundus: firm Incision: Healing well with no significant drainage, No significant erythema DVT Evaluation: No evidence of DVT seen on physical exam. Negative Homan's sign. No cords or calf tenderness.  Labs: Lab Results  Component Value Date   WBC 17.3 (H) 11/21/2015   HGB 9.8 (L) 11/21/2015   HCT 28.4 (L) 11/21/2015   MCV 82.3 11/21/2015   PLT 242 11/21/2015   CMP Latest Ref Rng & Units 11/18/2015  Glucose 65 - 99 mg/dL 87  BUN 6 - 20 mg/dL 12  Creatinine 4.09 - 8.11 mg/dL 9.14  Sodium 782 - 956 mmol/L 137  Potassium 3.5 - 5.1 mmol/L 3.9  Chloride 101 - 111 mmol/L 108  CO2 22 - 32 mmol/L 22  Calcium 8.9 - 10.3 mg/dL 9.0  Total Protein 6.5 - 8.1 g/dL 6.9  Total Bilirubin 0.3 - 1.2 mg/dL 0.6  Alkaline Phos 38 - 126 U/L 127(H)  AST 15 - 41 U/L 19  ALT 14 - 54 U/L 13(L)   Prenatal labs: ABO, Rh: O/Positive/-- (03/01 0000) Antibody: Rubella:  RPR: Negative  HBsAg: Negative (03/01 0000)  HIV: Non-reactive (03/01 0000)  GBS: Negative Genetic screening  wnl Anatomy US wnl  Discharge  instruction: per After Visit Summary and "Baby and Me Booklet".  After Visit Meds:    Medication List    STOP taking these medications   diphenhydrAMINE 25 MG tablet Commonly known as:  BENADRYL     TAKE these medications   ferrous sulfate 325 (65 FE) MG tablet Take 1 tablet (325 mg total) by mouth 2 (two) times daily with a meal.   ibuprofen 600 MG tablet Commonly known as:  ADVIL,MOTRIN Take 1 tablet (600 mg total) by mouth every 6 (six) hours as needed for fever, headache, mild pain, moderate pain or cramping.   prenatal multivitamin Tabs tablet Take 1 tablet by mouth daily.       Diet: Iron rich  Activity: Advance as tolerated. Pelvic rest for 6 weeks.   Outpatient follow up: 1 week office visit for BP check  Postpartum contraception: None  11/21/2015 Sherre Scarlet, CNM

## 2015-11-21 NOTE — Addendum Note (Signed)
Addendum  created 11/21/15 1146 by Sherrian DiversBruce Dalyn Becker, MD   Anesthesia Staff edited

## 2015-11-21 NOTE — Discharge Instructions (Signed)
Contraception Choices Contraception (birth control) is the use of any methods or devices to prevent pregnancy. Below are some methods to help avoid pregnancy. HORMONAL METHODS   Contraceptive implant. This is a thin, plastic tube containing progesterone hormone. It does not contain estrogen hormone. Your health care provider inserts the tube in the inner part of the upper arm. The tube can remain in place for up to 3 years. After 3 years, the implant must be removed. The implant prevents the ovaries from releasing an egg (ovulation), thickens the cervical mucus to prevent sperm from entering the uterus, and thins the lining of the inside of the uterus.  Progesterone-only injections. These injections are given every 3 months by your health care provider to prevent pregnancy. This synthetic progesterone hormone stops the ovaries from releasing eggs. It also thickens cervical mucus and changes the uterine lining. This makes it harder for sperm to survive in the uterus.  Birth control pills. These pills contain estrogen and progesterone hormone. They work by preventing the ovaries from releasing eggs (ovulation). They also cause the cervical mucus to thicken, preventing the sperm from entering the uterus. Birth control pills are prescribed by a health care provider.Birth control pills can also be used to treat heavy periods.  Minipill. This type of birth control pill contains only the progesterone hormone. They are taken every day of each month and must be prescribed by your health care provider.  Birth control patch. The patch contains hormones similar to those in birth control pills. It must be changed once a week and is prescribed by a health care provider.  Vaginal ring. The ring contains hormones similar to those in birth control pills. It is left in the vagina for 3 weeks, removed for 1 week, and then a new one is put back in place. The patient must be comfortable inserting and removing the ring  from the vagina.A health care provider's prescription is necessary.  Emergency contraception. Emergency contraceptives prevent pregnancy after unprotected sexual intercourse. This pill can be taken right after sex or up to 5 days after unprotected sex. It is most effective the sooner you take the pills after having sexual intercourse. Most emergency contraceptive pills are available without a prescription. Check with your pharmacist. Do not use emergency contraception as your only form of birth control. BARRIER METHODS   Female condom. This is a thin sheath (latex or rubber) that is worn over the penis during sexual intercourse. It can be used with spermicide to increase effectiveness.  Female condom. This is a soft, loose-fitting sheath that is put into the vagina before sexual intercourse.  Diaphragm. This is a soft, latex, dome-shaped barrier that must be fitted by a health care provider. It is inserted into the vagina, along with a spermicidal jelly. It is inserted before intercourse. The diaphragm should be left in the vagina for 6 to 8 hours after intercourse.  Cervical cap. This is a round, soft, latex or plastic cup that fits over the cervix and must be fitted by a health care provider. The cap can be left in place for up to 48 hours after intercourse.  Sponge. This is a soft, circular piece of polyurethane foam. The sponge has spermicide in it. It is inserted into the vagina after wetting it and before sexual intercourse.  Spermicides. These are chemicals that kill or block sperm from entering the cervix and uterus. They come in the form of creams, jellies, suppositories, foam, or tablets. They do not require a  prescription. They are inserted into the vagina with an applicator before having sexual intercourse. The process must be repeated every time you have sexual intercourse. INTRAUTERINE CONTRACEPTION  Intrauterine device (IUD). This is a T-shaped device that is put in a woman's uterus  during a menstrual period to prevent pregnancy. There are 2 types:  Copper IUD. This type of IUD is wrapped in copper wire and is placed inside the uterus. Copper makes the uterus and fallopian tubes produce a fluid that kills sperm. It can stay in place for 10 years.  Hormone IUD. This type of IUD contains the hormone progestin (synthetic progesterone). The hormone thickens the cervical mucus and prevents sperm from entering the uterus, and it also thins the uterine lining to prevent implantation of a fertilized egg. The hormone can weaken or kill the sperm that get into the uterus. It can stay in place for 3-5 years, depending on which type of IUD is used. PERMANENT METHODS OF CONTRACEPTION  Female tubal ligation. This is when the woman's fallopian tubes are surgically sealed, tied, or blocked to prevent the egg from traveling to the uterus.  Hysteroscopic sterilization. This involves placing a small coil or insert into each fallopian tube. Your doctor uses a technique called hysteroscopy to do the procedure. The device causes scar tissue to form. This results in permanent blockage of the fallopian tubes, so the sperm cannot fertilize the egg. It takes about 3 months after the procedure for the tubes to become blocked. You must use another form of birth control for these 3 months.  Female sterilization. This is when the female has the tubes that carry sperm tied off (vasectomy).This blocks sperm from entering the vagina during sexual intercourse. After the procedure, the man can still ejaculate fluid (semen). NATURAL PLANNING METHODS  Natural family planning. This is not having sexual intercourse or using a barrier method (condom, diaphragm, cervical cap) on days the woman could become pregnant.  Calendar method. This is keeping track of the length of each menstrual cycle and identifying when you are fertile.  Ovulation method. This is avoiding sexual intercourse during ovulation.  Symptothermal  method. This is avoiding sexual intercourse during ovulation, using a thermometer and ovulation symptoms.  Post-ovulation method. This is timing sexual intercourse after you have ovulated. Regardless of which type or method of contraception you choose, it is important that you use condoms to protect against the transmission of sexually transmitted infections (STIs). Talk with your health care provider about which form of contraception is most appropriate for you.   This information is not intended to replace advice given to you by your health care provider. Make sure you discuss any questions you have with your health care provider.   Document Released: 01/28/2005 Document Revised: 02/02/2013 Document Reviewed: 07/23/2012 Elsevier Interactive Patient Education 2016 [employer] Breastfeeding and Mastitis Mastitis is inflammation of the breast tissue. It can occur in women who are breastfeeding. This can make breastfeeding painful. Mastitis will sometimes go away on its own. Your health care provider will help determine if treatment is needed. CAUSES Mastitis is often associated with a blocked milk (lactiferous) duct. This can happen when too much milk builds up in the breast. Causes of excess milk in the breast can include:  Poor latch-on. If your baby is not latched onto the breast properly, she or he may not empty your breast completely while breastfeeding.  Allowing too much time to pass between feedings.  Wearing a bra or other clothing that is  too tight. This puts extra pressure on the lactiferous ducts so milk does not flow through them as it should. Mastitis can also be caused by a bacterial infection. Bacteria may enter the breast tissue through cuts or openings in the skin. In women who are breastfeeding, this may occur because of cracked or irritated skin. Cracks in the skin are often caused when your baby does not latch on properly to the breast. SIGNS AND SYMPTOMS  Swelling,  redness, tenderness, and pain in an area of the breast.  Swelling of the glands under the arm on the same side.  Fever may or may not accompany mastitis. If an infection is allowed to progress, a collection of pus (abscess) may develop. DIAGNOSIS  Your health care provider can usually diagnose mastitis based on your symptoms and a physical exam. Tests may be done to help confirm the diagnosis. These may include:  Removal of pus from the breast by applying pressure to the area. This pus can be examined in the lab to determine which bacteria are present. If an abscess has developed, the fluid in the abscess can be removed with a needle. This can also be used to confirm the diagnosis and determine the bacteria present. In most cases, pus will not be present.  Blood tests to determine if your body is fighting a bacterial infection.  Mammogram or ultrasound tests to rule out other problems or diseases. TREATMENT  Mastitis that occurs with breastfeeding will sometimes go away on its own. Your health care provider may choose to wait 24 hours after first seeing you to decide whether a prescription medicine is needed. If your symptoms are worse after 24 hours, your health care provider will likely prescribe an antibiotic medicine to treat the mastitis. He or she will determine which bacteria are most likely causing the infection and will then select an appropriate antibiotic medicine. This is sometimes changed based on the results of tests performed to identify the bacteria, or if there is no response to the antibiotic medicine selected. Antibiotic medicines are usually given by mouth. You may also be given medicine for pain. HOME CARE INSTRUCTIONS  Only take over-the-counter or prescription medicines for pain, fever, or discomfort as directed by your health care provider.  If your health care provider prescribed an antibiotic medicine, take the medicine as directed. Make sure you finish it even if you  start to feel better.  Do not wear a tight or underwire bra. Wear a soft, supportive bra.  Increase your fluid intake, especially if you have a fever.  Continue to empty the breast. Your health care provider can tell you whether this milk is safe for your infant or needs to be thrown out. You may be told to stop nursing until your health care provider thinks it is safe for your baby. Use a breast pump if you are advised to stop nursing.  Keep your nipples clean and dry.  Empty the first breast completely before going to the other breast. If your baby is not emptying your breasts completely for some reason, use a breast pump to empty your breasts.  If you go back to work, pump your breasts while at work to stay in time with your nursing schedule.  Avoid allowing your breasts to become overly filled with milk (engorged). SEEK MEDICAL CARE IF:  You have pus-like discharge from the breast.  Your symptoms do not improve with the treatment prescribed by your health care provider within 2 days. SEEK IMMEDIATE  MEDICAL CARE IF:  Your pain and swelling are getting worse.  You have pain that is not controlled with medicine.  You have a red line extending from the breast toward your armpit.  You have a fever or persistent symptoms for more than 2-3 days.  You have a fever and your symptoms suddenly get worse. MAKE SURE YOU:   Understand these instructions.  Will watch your condition.  Will get help right away if you are not doing well or get worse.   This information is not intended to replace advice given to you by your health care provider. Make sure you discuss any questions you have with your health care provider.   Document Released: 05/25/2004 Document Revised: 02/02/2013 Document Reviewed: 09/03/2012 Elsevier Interactive Patient Education [employer]. Breastfeeding Deciding to breastfeed is one of the best choices you can make for you and your baby. A change in hormones  during pregnancy causes your breast tissue to grow and increases the number and size of your milk ducts. These hormones also allow proteins, sugars, and fats from your blood supply to make breast milk in your milk-producing glands. Hormones prevent breast milk from being released before your baby is born as well as prompt milk flow after birth. Once breastfeeding has begun, thoughts of your baby, as well as his or her sucking or crying, can stimulate the release of milk from your milk-producing glands.  BENEFITS OF BREASTFEEDING For Your Baby  Your first milk (colostrum) helps your baby's digestive system function better.  There are antibodies in your milk that help your baby fight off infections.  Your baby has a lower incidence of asthma, allergies, and sudden infant death syndrome.  The nutrients in breast milk are better for your baby than infant formulas and are designed uniquely for your baby's needs.  Breast milk improves your baby's brain development.  Your baby is less likely to develop other conditions, such as childhood obesity, asthma, or type 2 diabetes mellitus. For You  Breastfeeding helps to create a very special bond between you and your baby.  Breastfeeding is convenient. Breast milk is always available at the correct temperature and costs nothing.  Breastfeeding helps to burn calories and helps you lose the weight gained during pregnancy.  Breastfeeding makes your uterus contract to its prepregnancy size faster and slows bleeding (lochia) after you give birth.   Breastfeeding helps to lower your risk of developing type 2 diabetes mellitus, osteoporosis, and breast or ovarian cancer later in life. SIGNS THAT YOUR BABY IS HUNGRY Early Signs of Hunger  Increased alertness or activity.  Stretching.  Movement of the head from side to side.  Movement of the head and opening of the mouth when the corner of the mouth or cheek is stroked (rooting).  Increased sucking  sounds, smacking lips, cooing, sighing, or squeaking.  Hand-to-mouth movements.  Increased sucking of fingers or hands. Late Signs of Hunger  Fussing.  Intermittent crying. Extreme Signs of Hunger Signs of extreme hunger will require calming and consoling before your baby will be able to breastfeed successfully. Do not wait for the following signs of extreme hunger to occur before you initiate breastfeeding:  Restlessness.  A loud, strong cry.  Screaming. BREASTFEEDING BASICS Breastfeeding Initiation  Find a comfortable place to sit or lie down, with your neck and back well supported.  Place a pillow or rolled up blanket under your baby to bring him or her to the level of your breast (if you are  seated). Nursing pillows are specially designed to help support your arms and your baby while you breastfeed.  Make sure that your baby's abdomen is facing your abdomen.  Gently massage your breast. With your fingertips, massage from your chest wall toward your nipple in a circular motion. This encourages milk flow. You may need to continue this action during the feeding if your milk flows slowly.  Support your breast with 4 fingers underneath and your thumb above your nipple. Make sure your fingers are well away from your nipple and your baby's mouth.  Stroke your baby's lips gently with your finger or nipple.  When your baby's mouth is open wide enough, quickly bring your baby to your breast, placing your entire nipple and as much of the colored area around your nipple (areola) as possible into your baby's mouth.  More areola should be visible above your baby's upper lip than below the lower lip.  Your baby's tongue should be between his or her lower gum and your breast.  Ensure that your baby's mouth is correctly positioned around your nipple (latched). Your baby's lips should create a seal on your breast and be turned out (everted).  It is common for your baby to suck about 2-3  minutes in order to start the flow of breast milk. Latching Teaching your baby how to latch on to your breast properly is very important. An improper latch can cause nipple pain and decreased milk supply for you and poor weight gain in your baby. Also, if your baby is not latched onto your nipple properly, he or she may swallow some air during feeding. This can make your baby fussy. Burping your baby when you switch breasts during the feeding can help to get rid of the air. However, teaching your baby to latch on properly is still the best way to prevent fussiness from swallowing air while breastfeeding. Signs that your baby has successfully latched on to your nipple:  Silent tugging or silent sucking, without causing you pain.  Swallowing heard between every 3-4 sucks.  Muscle movement above and in front of his or her ears while sucking. Signs that your baby has not successfully latched on to nipple:  Sucking sounds or smacking sounds from your baby while breastfeeding.  Nipple pain. If you think your baby has not latched on correctly, slip your finger into the corner of your baby's mouth to break the suction and place it between your baby's gums. Attempt breastfeeding initiation again. Signs of Successful Breastfeeding Signs from your baby:  A gradual decrease in the number of sucks or complete cessation of sucking.  Falling asleep.  Relaxation of his or her body.  Retention of a small amount of milk in his or her mouth.  Letting go of your breast by himself or herself. Signs from you:  Breasts that have increased in firmness, weight, and size 1-3 hours after feeding.  Breasts that are softer immediately after breastfeeding.  Increased milk volume, as well as a change in milk consistency and color by the fifth day of breastfeeding.  Nipples that are not sore, cracked, or bleeding. Signs That Your [employer] is Getting Enough Milk  Wetting at least 3 diapers in a 24-hour period. The  urine should be clear and pale yellow by age 40 days.  At least 3 stools in a 24-hour period by age 40 days. The stool should be soft and yellow.  At least 3 stools in a 24-hour period by age 23 days. The  stool should be seedy and yellow.  No loss of weight greater than 10% of birth weight during the first 34 days of age.  Average weight gain of 4-7 ounces (113-198 g) per week after age 18 days.  Consistent daily weight gain by age 7 days, without weight loss after the age of 2 weeks. After a feeding, your baby may spit up a small amount. This is common. BREASTFEEDING FREQUENCY AND DURATION Frequent feeding will help you make more milk and can prevent sore nipples and breast engorgement. Breastfeed when you feel the need to reduce the fullness of your breasts or when your baby shows signs of hunger. This is called "breastfeeding on demand." Avoid introducing a pacifier to your baby while you are working to establish breastfeeding (the first 4-6 weeks after your baby is born). After this time you may choose to use a pacifier. Research has shown that pacifier use during the first year of a baby's life decreases the risk of sudden infant death syndrome (SIDS). Allow your baby to feed on each breast as long as he or she wants. Breastfeed until your baby is finished feeding. When your baby unlatches or falls asleep while feeding from the first breast, offer the second breast. Because newborns are often sleepy in the first few weeks of life, you may need to awaken your baby to get him or her to feed. Breastfeeding times will vary from baby to baby. However, the following rules can serve as a guide to help you ensure that your baby is properly fed:  Newborns (babies 11 weeks of age or younger) may breastfeed every 1-3 hours.  Newborns should not go longer than 3 hours during the day or 5 hours during the night without breastfeeding.  You should breastfeed your baby a minimum of 8 times in a 24-hour period  until you begin to introduce solid foods to your baby at around 37 months of age. BREAST MILK PUMPING Pumping and storing breast milk allows you to ensure that your baby is exclusively fed your breast milk, even at times when you are unable to breastfeed. This is especially important if you are going back to work while you are still breastfeeding or when you are not able to be present during feedings. Your lactation consultant can give you guidelines on how long it is safe to store breast milk. A breast pump is a machine that allows you to pump milk from your breast into a sterile bottle. The pumped breast milk can then be stored in a refrigerator or freezer. Some breast pumps are operated by hand, while others use electricity. Ask your lactation consultant which type will work best for you. Breast pumps can be purchased, but some hospitals and breastfeeding support groups lease breast pumps on a monthly basis. A lactation consultant can teach you how to hand express breast milk, if you prefer not to use a pump. CARING FOR YOUR BREASTS WHILE YOU BREASTFEED Nipples can become dry, cracked, and sore while breastfeeding. The following recommendations can help keep your breasts moisturized and healthy:  Avoid using soap on your nipples.  Wear a supportive bra. Although not required, special nursing bras and tank tops are designed to allow access to your breasts for breastfeeding without taking off your entire bra or top. Avoid wearing underwire-style bras or extremely tight bras.  Air dry your nipples for 3-43mnutes after each feeding.  Use only cotton bra pads to absorb leaked breast milk. Leaking of breast milk between feedings  is normal.  Use lanolin on your nipples after breastfeeding. Lanolin helps to maintain your skin's normal moisture barrier. If you use pure lanolin, you do not need to wash it off before feeding your baby again. Pure lanolin is not toxic to your baby. You may also hand express a  few drops of breast milk and gently massage that milk into your nipples and allow the milk to air dry. In the first few weeks after giving birth, some women experience extremely full breasts (engorgement). Engorgement can make your breasts feel heavy, warm, and tender to the touch. Engorgement peaks within 3-5 days after you give birth. The following recommendations can help ease engorgement:  Completely empty your breasts while breastfeeding or pumping. You may want to start by applying warm, moist heat (in the shower or with warm water-soaked hand towels) just before feeding or pumping. This increases circulation and helps the milk flow. If your baby does not completely empty your breasts while breastfeeding, pump any extra milk after he or she is finished.  Wear a snug bra (nursing or regular) or tank top for 1-2 days to signal your body to slightly decrease milk production.  Apply ice packs to your breasts, unless this is too uncomfortable for you.  Make sure that your baby is latched on and positioned properly while breastfeeding. If engorgement persists after 48 hours of following these recommendations, contact your health care provider or a Science writer. OVERALL HEALTH CARE RECOMMENDATIONS WHILE BREASTFEEDING  Eat healthy foods. Alternate between meals and snacks, eating 3 of each per day. Because what you eat affects your breast milk, some of the foods may make your baby more irritable than usual. Avoid eating these foods if you are sure that they are negatively affecting your baby.  Drink milk, fruit juice, and water to satisfy your thirst (about 10 glasses a day).  Rest often, relax, and continue to take your prenatal vitamins to prevent fatigue, stress, and anemia.  Continue breast self-awareness checks.  Avoid chewing and smoking tobacco. Chemicals from cigarettes that pass into breast milk and exposure to secondhand smoke may harm your baby.  Avoid alcohol and drug use,  including marijuana. Some medicines that may be harmful to your baby can pass through breast milk. It is important to ask your health care provider before taking any medicine, including all over-the-counter and prescription medicine as well as vitamin and herbal supplements. It is possible to become pregnant while breastfeeding. If birth control is desired, ask your health care provider about options that will be safe for your baby. SEEK MEDICAL CARE IF:  You feel like you want to stop breastfeeding or have become frustrated with breastfeeding.  You have painful breasts or nipples.  Your nipples are cracked or bleeding.  Your breasts are red, tender, or warm.  You have a swollen area on either breast.  You have a fever or chills.  You have nausea or vomiting.  You have drainage other than breast milk from your nipples.  Your breasts do not become full before feedings by the fifth day after you give birth.  You feel sad and depressed.  Your baby is too sleepy to eat well.  Your baby is having trouble sleeping.   Your baby is wetting less than 3 diapers in a 24-hour period.  Your baby has less than 3 stools in a 24-hour period.  Your baby's skin or the white part of his or her eyes becomes yellow.   Your baby is  not gaining weight by 34 days of age. SEEK IMMEDIATE MEDICAL CARE IF:  Your baby is overly tired (lethargic) and does not want to wake up and feed.  Your baby develops an unexplained fever.   This information is not intended to replace advice given to you by your health care provider. Make sure you discuss any questions you have with your health care provider.   Document Released: 01/28/2005 Document Revised: 10/19/2014 Document Reviewed: 07/22/2012 Elsevier Interactive Patient Education 2016 [employer]. Iron-Rich Diet Iron is a mineral that helps your body to produce hemoglobin. Hemoglobin is a protein in your red blood cells that carries oxygen to your  body's tissues. Eating too little iron may cause you to feel weak and tired, and it can increase your risk for infection. Eating enough iron is necessary for your body's metabolism, muscle function, and nervous system. Iron is naturally found in many foods. It can also be added to foods or fortified in foods. There are two types of dietary iron:  Heme iron. Heme iron is absorbed by the body more easily than nonheme iron. Heme iron is found in meat, poultry, and fish.  Nonheme iron. Nonheme iron is found in dietary supplements, iron-fortified grains, beans, and vegetables. You may need to follow an iron-rich diet if:  You have been diagnosed with iron deficiency or iron-deficiency anemia.  You have a condition that prevents you from absorbing dietary iron, such as:  Infection in your intestines.  Celiac disease. This involves long-lasting (chronic) inflammation of your intestines.  You do not eat enough iron.  You eat a diet that is high in foods that impair iron absorption.  You have lost a lot of blood.  You have heavy bleeding during your menstrual cycle.  You are pregnant. WHAT IS MY PLAN? Your health care provider may help you to determine how much iron you need per day based on your condition. Generally, when a person consumes sufficient amounts of iron in the diet, the following iron needs are met:  Men.  19-48 years old: 11 mg per day.  71-69 years old: 8 mg per day.  Women.   15-69 years old: 15 mg per day.  76-6 years old: 18 mg per day.  Over 56 years old: 8 mg per day.  Pregnant women: 27 mg per day.  Breastfeeding women: 9 mg per day. WHAT DO I NEED TO KNOW ABOUT AN IRON-RICH DIET?  Eat fresh fruits and vegetables that are high in vitamin C along with foods that are high in iron. This will help increase the amount of iron that your body absorbs from food, especially with foods containing nonheme iron. Foods that are high in vitamin C include oranges,  peppers, tomatoes, and mango.  Take iron supplements only as directed by your health care provider. Overdose of iron can be life-threatening. If you were prescribed iron supplements, take them with orange juice or a vitamin C supplement.  Cook foods in pots and pans that are made from iron.   Eat nonheme iron-containing foods alongside foods that are high in heme iron. This helps to improve your iron absorption.   Certain foods and drinks contain compounds that impair iron absorption. Avoid eating these foods in the same meal as iron-rich foods or with iron supplements. These include:  Coffee, black tea, and red wine.  Milk, dairy products, and foods that are high in calcium.  Beans, soybeans, and peas.  Whole grains.  When eating foods that contain both  nonheme iron and compounds that impair iron absorption, follow these tips to absorb iron better.   Soak beans overnight before cooking.  Soak whole grains overnight and drain them before using.  Ferment flours before baking, such as using yeast in bread dough. WHAT FOODS CAN I EAT? Grains Iron-fortified breakfast cereal. Iron-fortified whole-wheat bread. Enriched rice. Sprouted grains. Vegetables Spinach. Potatoes with skin. Green peas. Broccoli. Red and green bell peppers. Fermented vegetables. Fruits Prunes. Raisins. Oranges. Strawberries. Mango. Grapefruit. Meats and Other Protein Sources Beef liver. Oysters. Beef. Shrimp. [place]. Chicken. [place]. Sardines. Chickpeas. Nuts. Tofu. Beverages Tomato juice. Fresh orange juice. Prune juice. Hibiscus tea. Fortified instant breakfast shakes. Condiments Tahini. Fermented soy sauce. Sweets and Desserts Black-strap molasses.  Other Wheat germ. The items listed above may not be a complete list of recommended foods or beverages. Contact your dietitian for more options. WHAT FOODS ARE NOT RECOMMENDED? Grains Whole grains. Bran cereal. Bran flour.  Oats. Vegetables Artichokes. Brussels sprouts. Kale. Fruits Blueberries. Raspberries. Strawberries. Figs. Meats and Other Protein Sources Soybeans. Products made from soy protein. Dairy Milk. Cream. Cheese. Yogurt. Cottage cheese. Beverages Coffee. Black tea. Red wine. Sweets and Desserts Cocoa. Chocolate. Ice cream. Other Basil. Oregano. Parsley. The items listed above may not be a complete list of foods and beverages to avoid. Contact your dietitian for more information.   This information is not intended to replace advice given to you by your health care provider. Make sure you discuss any questions you have with your health care provider.   Document Released: 09/11/2004 Document Revised: 02/18/2014 Document Reviewed: 08/25/2013 Elsevier Interactive Patient Education 2016 [employer]. Anemia, Nonspecific Anemia is a condition in which the concentration of red blood cells or hemoglobin in the blood is below normal. Hemoglobin is a substance in red blood cells that carries oxygen to the tissues of the body. Anemia results in not enough oxygen reaching these tissues.  CAUSES  Common causes of anemia include:   Excessive bleeding. Bleeding may be internal or external. This includes excessive bleeding from periods (in women) or from the intestine.   Poor nutrition.   Chronic kidney, thyroid, and liver disease.  Bone marrow disorders that decrease red blood cell production.  Cancer and treatments for cancer.  HIV, AIDS, and their treatments.  Spleen problems that increase red blood cell destruction.  Blood disorders.  Excess destruction of red blood cells due to infection, medicines, and autoimmune disorders. SIGNS AND SYMPTOMS   Minor weakness.   Dizziness.   Headache.  Palpitations.   Shortness of breath, especially with exercise.   Paleness.  Cold sensitivity.  Indigestion.  Nausea.  Difficulty sleeping.  Difficulty  concentrating. Symptoms may occur suddenly or they may develop slowly.  DIAGNOSIS  Additional blood tests are often needed. These help your health care provider determine the best treatment. Your health care provider will check your stool for blood and look for other causes of blood loss.  TREATMENT  Treatment varies depending on the cause of the anemia. Treatment can include:   Supplements of iron, vitamin G64, or folic acid.   Hormone medicines.   A blood transfusion. This may be needed if blood loss is severe.   Hospitalization. This may be needed if there is significant continual blood loss.   Dietary changes.  Spleen removal. HOME CARE INSTRUCTIONS Keep all follow-up appointments. It often takes many weeks to correct anemia, and having your health care provider check on your condition and your response to treatment is very important. SEEK  IMMEDIATE MEDICAL CARE IF:   You develop extreme weakness, shortness of breath, or chest pain.   You become dizzy or have trouble concentrating.  You develop heavy vaginal bleeding.   You develop a rash.   You have bloody or black, tarry stools.   You faint.   You vomit up blood.   You vomit repeatedly.   You have abdominal pain.  You have a fever or persistent symptoms for more than 2-3 days.   You have a fever and your symptoms suddenly get worse.   You are dehydrated.  MAKE SURE YOU:  Understand these instructions.  Will watch your condition.  Will get help right away if you are not doing well or get worse.   This information is not intended to replace advice given to you by your health care provider. Make sure you discuss any questions you have with your health care provider.   Document Released: 03/07/2004 Document Revised: 09/30/2012 Document Reviewed: 07/24/2012 Elsevier Interactive Patient Education 2016 [employer]. Postpartum Depression and Baby Blues The postpartum period begins right after  the birth of a baby. During this time, there is often a great amount of joy and excitement. It is also a time of many changes in the life of the parents. Regardless of how many times a mother gives birth, each child brings new challenges and dynamics to the family. It is not unusual to have feelings of excitement along with confusing shifts in moods, emotions, and thoughts. All mothers are at risk of developing postpartum depression or the "baby blues." These mood changes can occur right after giving birth, or they may occur many months after giving birth. The baby blues or postpartum depression can be mild or severe. Additionally, postpartum depression can go away rather quickly, or it can be a long-term condition.  CAUSES Raised hormone levels and the rapid drop in those levels are thought to be a main cause of postpartum depression and the baby blues. A number of hormones change during and after pregnancy. Estrogen and progesterone usually decrease right after the delivery of your baby. The levels of thyroid hormone and various cortisol steroids also rapidly drop. Other factors that play a role in these mood changes include major life events and genetics.  RISK FACTORS If you have any of the following risks for the baby blues or postpartum depression, know what symptoms to watch out for during the postpartum period. Risk factors that may increase the likelihood of getting the baby blues or postpartum depression include:  Having a personal or family history of depression.   Having depression while being pregnant.   Having premenstrual mood issues or mood issues related to oral contraceptives.  Having a lot of life stress.   Having marital conflict.   Lacking a social support network.   Having a baby with special needs.   Having health problems, such as diabetes.  SIGNS AND SYMPTOMS Symptoms of baby blues include:  Brief changes in mood, such as going from extreme happiness to  sadness.  Decreased concentration.   Difficulty sleeping.   Crying spells, tearfulness.   Irritability.   Anxiety.  Symptoms of postpartum depression typically begin within the first month after giving birth. These symptoms include:  Difficulty sleeping or excessive sleepiness.   Marked weight loss.   Agitation.   Feelings of worthlessness.   Lack of interest in activity or food.  Postpartum psychosis is a very serious condition and can be dangerous. Fortunately, it is rare. Displaying any  of the following symptoms is cause for immediate medical attention. Symptoms of postpartum psychosis include:   Hallucinations and delusions.   Bizarre or disorganized behavior.   Confusion or disorientation.  DIAGNOSIS  A diagnosis is made by an evaluation of your symptoms. There are no medical or lab tests that lead to a diagnosis, but there are various questionnaires that a health care provider may use to identify those with the baby blues, postpartum depression, or psychosis. Often, a screening tool called the [place] Postnatal Depression Scale is used to diagnose depression in the postpartum period.  TREATMENT The baby blues usually goes away on its own in 1-2 weeks. Social support is often all that is needed. You will be encouraged to get adequate sleep and rest. Occasionally, you may be given medicines to help you sleep.  Postpartum depression requires treatment because it can last several months or longer if it is not treated. Treatment may include individual or group therapy, medicine, or both to address any social, physiological, and psychological factors that may play a role in the depression. Regular exercise, a healthy diet, rest, and social support may also be strongly recommended.  Postpartum psychosis is more serious and needs treatment right away. Hospitalization is often needed. HOME CARE INSTRUCTIONS  Get as much rest as you can. Nap when the baby sleeps.    Exercise regularly. Some women find yoga and walking to be beneficial.   Eat a balanced and nourishing diet.   Do little things that you enjoy. Have a cup of tea, take a bubble bath, read your favorite magazine, or listen to your favorite music.  Avoid alcohol.   Ask for help with household chores, cooking, grocery shopping, or running errands as needed. Do not try to do everything.   Talk to people close to you about how you are feeling. Get support from your partner, family members, friends, or other new moms.  Try to stay positive in how you think. Think about the things you are grateful for.   Do not spend a lot of time alone.   Only take over-the-counter or prescription medicine as directed by your health care provider.  Keep all your postpartum appointments.   Let your health care provider know if you have any concerns.  SEEK MEDICAL CARE IF: You are having a reaction to or problems with your medicine. SEEK IMMEDIATE MEDICAL CARE IF:  You have suicidal feelings.   You think you may harm the baby or someone else. MAKE SURE YOU:  Understand these instructions.  Will watch your condition.  Will get help right away if you are not doing well or get worse.   This information is not intended to replace advice given to you by your health care provider. Make sure you discuss any questions you have with your health care provider.   Document Released: 11/02/2003 Document Revised: 02/02/2013 Document Reviewed: 11/09/2012 Elsevier Interactive Patient Education 2016 [employer]. Postpartum Care After Vaginal Delivery After you deliver your newborn (postpartum period), the usual stay in the hospital is 24-72 hours. If there were problems with your labor or delivery, or if you have other medical problems, you might be in the hospital longer.  While you are in the hospital, you will receive help and instructions on how to care for yourself and your newborn during the  postpartum period.  While you are in the hospital:  Be sure to tell your nurses if you have pain or discomfort, as well as where you feel  the pain and what makes the pain worse. °· If you had an incision made near your vagina (episiotomy) or if you had some tearing during delivery, the nurses may put ice packs on your episiotomy or tear. The ice packs may help to reduce the pain and swelling. °· If you are breastfeeding, you may feel uncomfortable contractions of your uterus for a couple of weeks. This is normal. The contractions help your uterus get back to normal size. °· It is normal to have some bleeding after delivery. °¨ For the first 1-3 days after delivery, the flow is red and the amount may be similar to a period. °¨ It is common for the flow to start and stop. °¨ In the first few days, you may pass some small clots. Let your nurses know if you begin to pass large clots or your flow increases. °¨ Do not  flush blood clots down the toilet before having the nurse look at them. °¨ During the next 3-10 days after delivery, your flow should become more watery and pink or brown-tinged in color. °¨ Ten to fourteen days after delivery, your flow should be a small amount of yellowish-white discharge. °¨ The amount of your flow will decrease over the first few weeks after delivery. Your flow may stop in 6-8 weeks. Most women have had their flow stop by 12 weeks after delivery. °· You should change your sanitary pads frequently. °· Wash your hands thoroughly with soap and water for at least 20 seconds after changing pads, using the toilet, or before holding or feeding your newborn. °· You should feel like you need to empty your bladder within the first 6-8 hours after delivery. °· In case you become weak, lightheaded, or faint, call your nurse before you get out of bed for the first time and before you take a shower for the first time. °· Within the first few days after delivery, your breasts may begin to feel  tender and full. This is called engorgement. Breast tenderness usually goes away within 48-72 hours after engorgement occurs. You may also notice milk leaking from your breasts. If you are not breastfeeding, do not stimulate your breasts. Breast stimulation can make your breasts produce more milk. °· Spending as much time as possible with your newborn is very important. During this time, you and your newborn can feel close and get to know each other. Having your newborn stay in your room (rooming in) will help to strengthen the bond with your newborn.  It will give you time to get to know your newborn and become comfortable caring for your newborn. °· Your hormones change after delivery. Sometimes the hormone changes can temporarily cause you to feel sad or tearful. These feelings should not last more than a few days. If these feelings last longer than that, you should talk to your caregiver. °· If desired, talk to your caregiver about methods of family planning or contraception. °· Talk to your caregiver about immunizations. Your caregiver may want you to have the following immunizations before leaving the hospital: °¨ Tetanus, diphtheria, and pertussis (Tdap) or tetanus and diphtheria (Td) immunization. It is very important that you and your family (including grandparents) or others caring for your newborn are up-to-date with the Tdap or Td immunizations. The Tdap or Td immunization can help protect your newborn from getting ill. °¨ Rubella immunization. °¨ Varicella (chickenpox) immunization. °¨ Influenza immunization. You should receive this annual immunization if you did not receive the immunization during your   pregnancy.   This information is not intended to replace advice given to you by your health care provider. Make sure you discuss any questions you have with your health care provider.   Document Released: 11/25/2006 Document Revised: 10/23/2011 Document Reviewed: 09/25/2011 Elsevier Interactive  Patient Education [employer].

## 2015-11-21 NOTE — Progress Notes (Signed)
First unit of PRBC transfusion was completed at 2140.  Second unit started at 2200. Stayed with patient for first 15 mins, no reaction noted. VS taken and documented.  Rate increased to 150 cc/hr @ 2215.

## 2016-09-20 LAB — OB RESULTS CONSOLE GC/CHLAMYDIA
CHLAMYDIA, DNA PROBE: NEGATIVE
Gonorrhea: NEGATIVE

## 2016-09-20 LAB — OB RESULTS CONSOLE HEPATITIS B SURFACE ANTIGEN: HEP B S AG: NEGATIVE

## 2016-09-20 LAB — OB RESULTS CONSOLE HIV ANTIBODY (ROUTINE TESTING): HIV: NONREACTIVE

## 2016-09-20 LAB — OB RESULTS CONSOLE RPR: RPR: NONREACTIVE

## 2016-09-20 LAB — OB RESULTS CONSOLE RUBELLA ANTIBODY, IGM: RUBELLA: IMMUNE

## 2016-09-20 LAB — OB RESULTS CONSOLE ABO/RH: RH TYPE: POSITIVE

## 2016-09-20 LAB — OB RESULTS CONSOLE ANTIBODY SCREEN: ANTIBODY SCREEN: NEGATIVE

## 2017-01-28 IMAGING — US US OB COMP LESS 14 WK
1 series · 15 of 28 positions shown · non-contrast
Comparison: None.

CLINICAL DATA: 19-year-old female with vaginal bleeding

EXAM:
OBSTETRIC <14 WK US AND TRANSVAGINAL OB US
TECHNIQUE: Both transabdominal and transvaginal ultrasound examinations were
performed for complete evaluation of the gestation as well as the
maternal uterus, adnexal regions, and pelvic cul-de-sac.
Transvaginal technique was performed to assess early pregnancy.

[Series 1: us ob comp less 14 wk · 71 acquisitions, 15 frames shown]
[im 1/71]
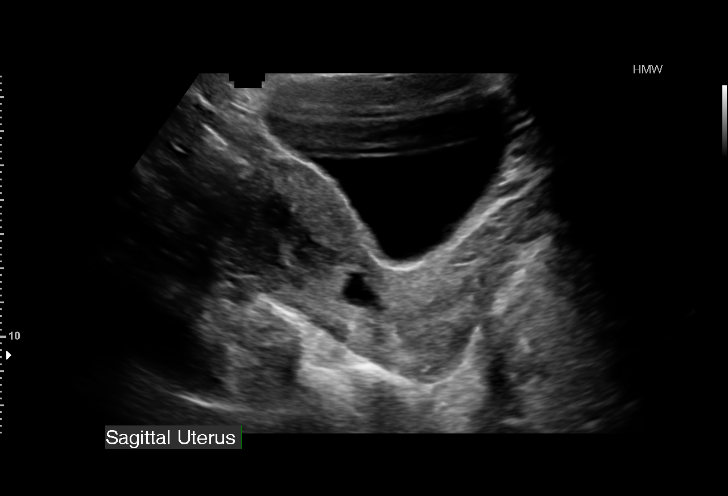
[im 6/71]
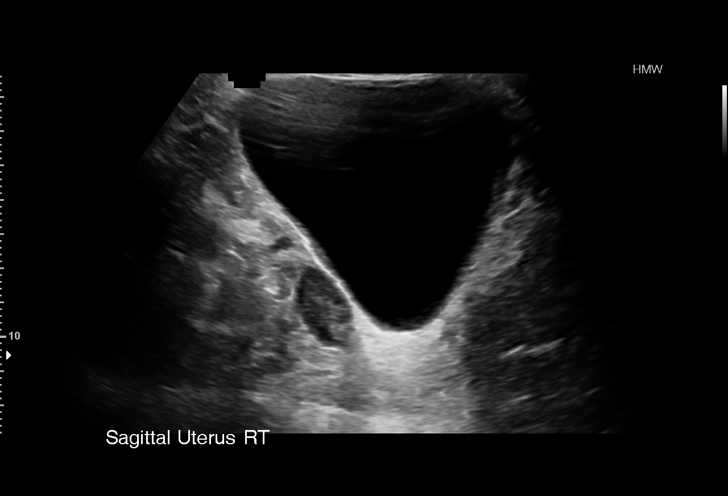
[im 11/71]
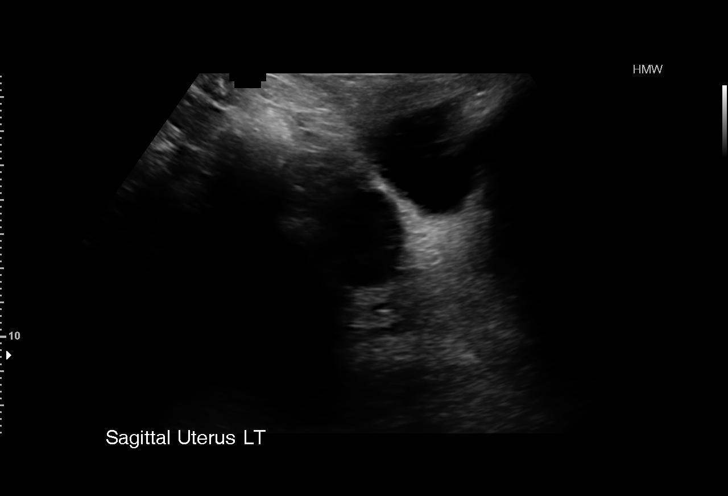
[im 16/71]
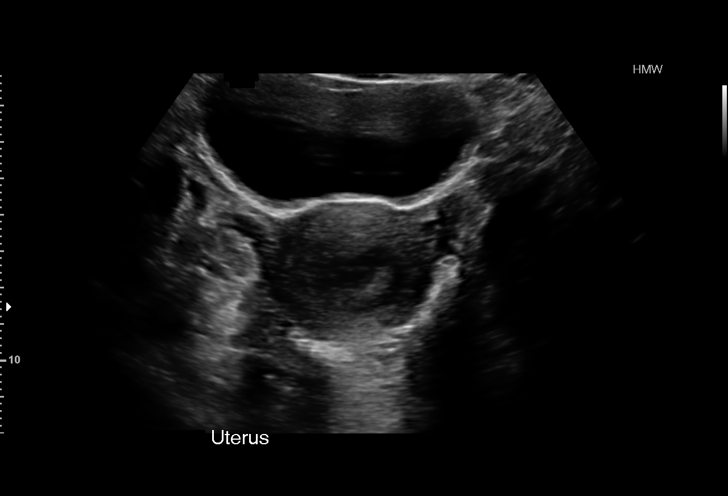
[im 21/71]
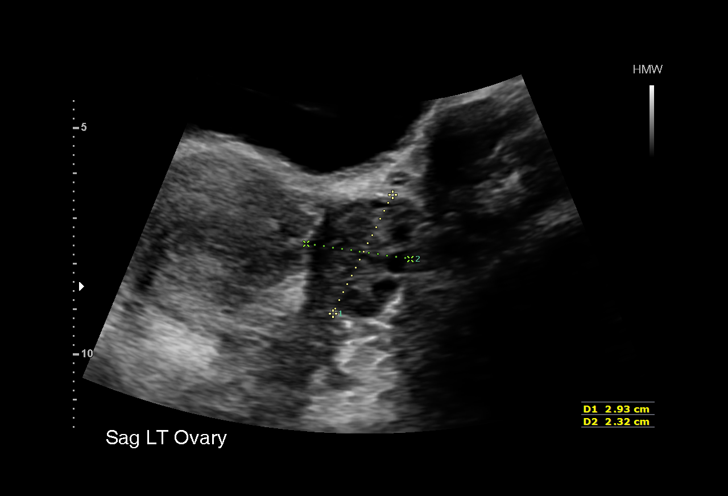
[im 26/71]
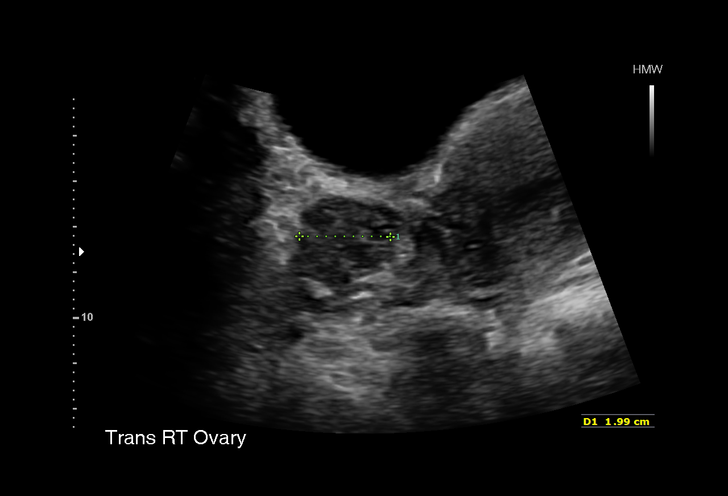
[im 32/71]
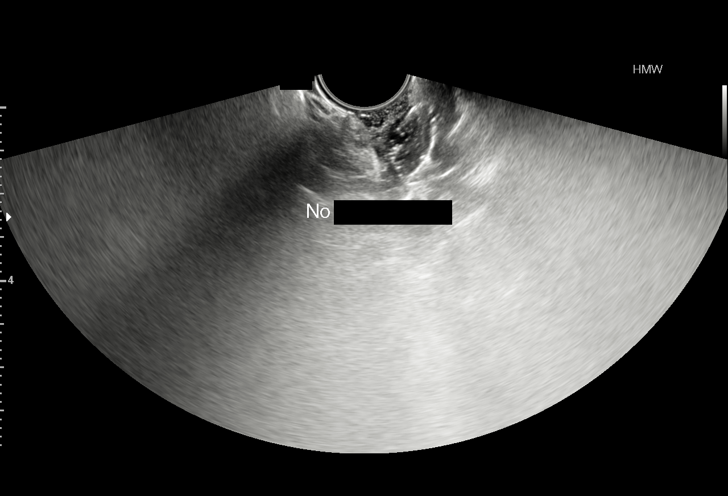
[im 37/71]
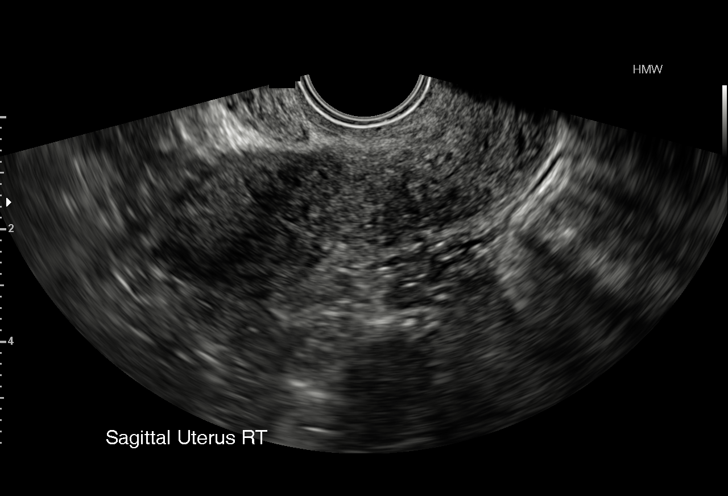
[im 39/71]
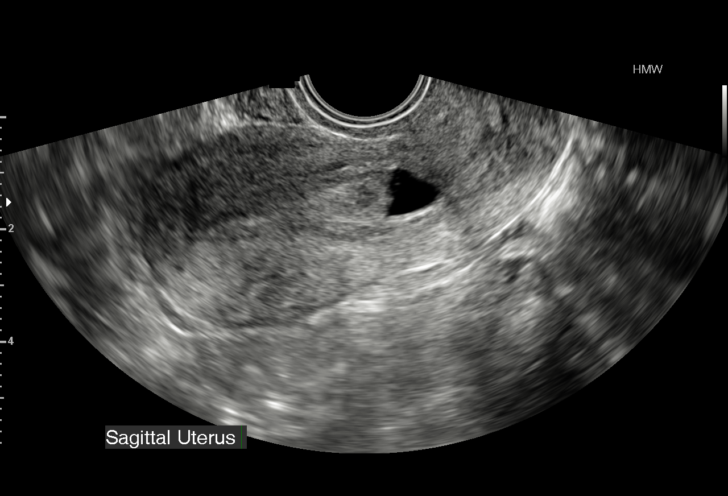
[im 45/71]
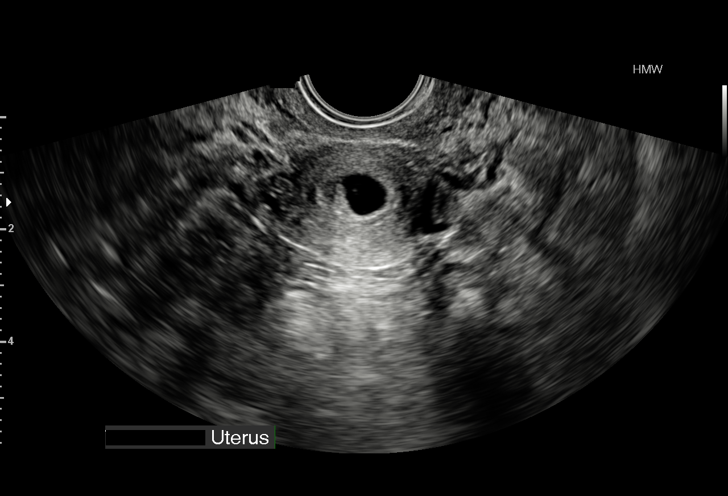
[im 50/71]
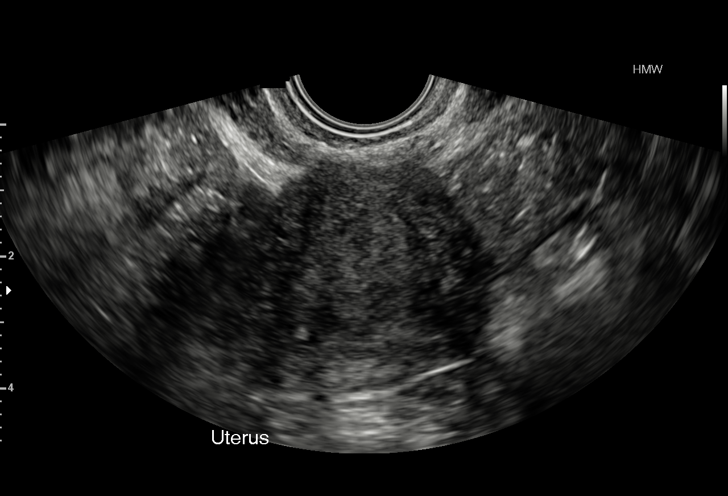
[im 55/71]
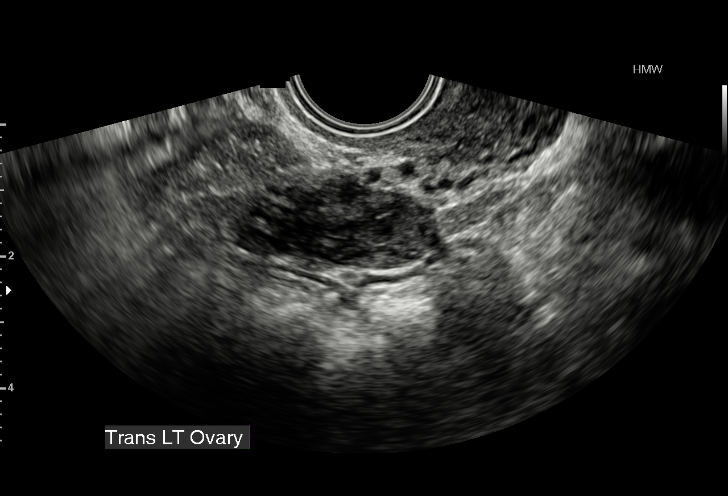
[im 60/71]
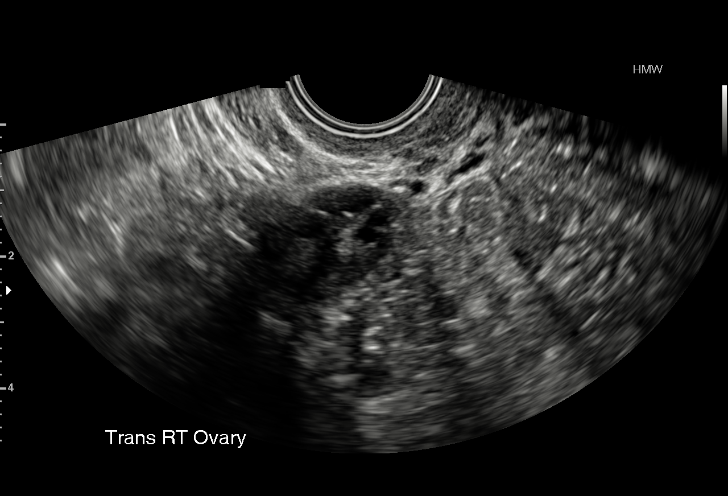
[im 65/71]
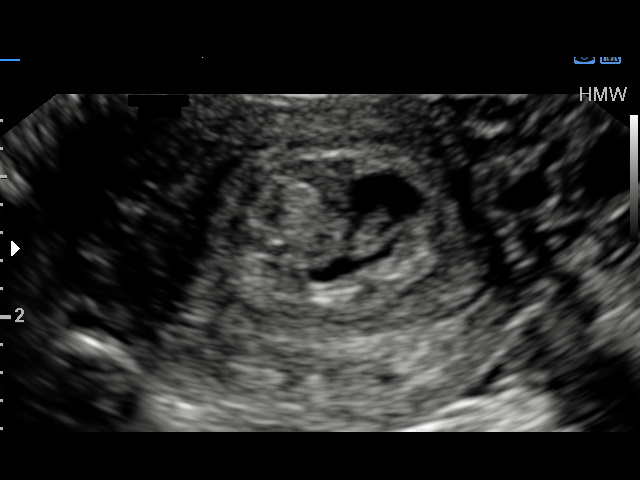
[im 71/71]
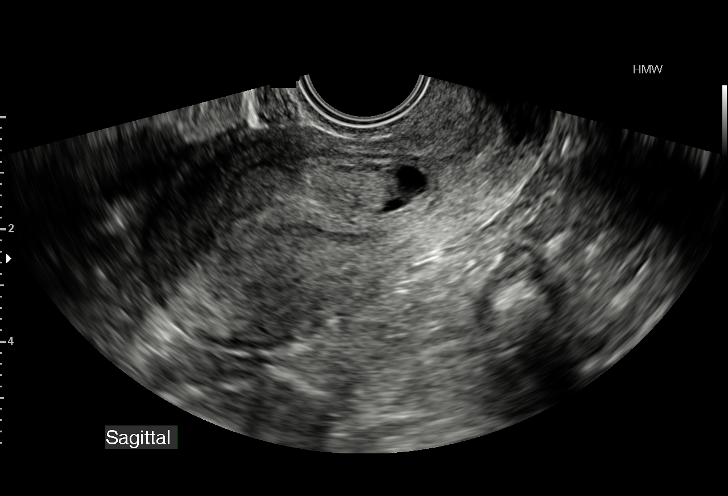

[15 of 28 positions shown; findings below may reference images not displayed]

FINDINGS: A single intrauterine gestational sac is noted containing a yolk sac
and fetal pole. The gestational sac appears to be in the lower
endometrium. No cardiac activity is identified at this time.
Follow-up is recommended.

CRL:  5  Mm   6 w   1 d                  US EDC: 10/18/2015

Subchorionic hemorrhage:  None visualized.

Maternal uterus/adnexae: The ovaries appear unremarkable. No
significant free fluid identified.
IMPRESSION: Single intrauterine pregnancy with gestational sac positioned in the
lower endometrium. The estimated gestational age based on crown-rump
length of 5 mm is 6 weeks, 1 day. No cardiac activity identified at
time. Follow-up recommended.

## 2017-02-11 NOTE — L&D Delivery Note (Signed)
   Delivery Note   At 8:40 AM a viable female, named Joselyn Glassmanyler,  was delivered via Vaginal, Spontaneous (Presentation: ROA )  APGAR: 8, 9 Placenta status:spontaneous, complete, 3 Vx Cord  Anesthesia:  epidural Episiotomy: None Lacerations: 2nd degree;Perineal Suture Repair: 3.0 Monocryl Est. Blood Loss (mL): 600  Patient given Cytotec 1000 mg per rectum for increased bleeding. Foley placed in bladder to help manage PP atony. Will be removed when patient is ambulating without difficulty  Mom to postpartum.  Baby to Couplet care / Skin to Skin  Breastfeeding desired. Outpatient circumcision  Crist FatSandra A Fidencio Duddy 03/26/2017, 9:44 AM

## 2017-02-27 LAB — OB RESULTS CONSOLE GBS: GBS: NEGATIVE

## 2017-03-25 ENCOUNTER — Telehealth (HOSPITAL_COMMUNITY): Payer: Self-pay | Admitting: *Deleted

## 2017-03-25 ENCOUNTER — Other Ambulatory Visit: Payer: Self-pay

## 2017-03-25 ENCOUNTER — Inpatient Hospital Stay (HOSPITAL_COMMUNITY)
Admission: AD | Admit: 2017-03-25 | Discharge: 2017-03-27 | DRG: 807 | Disposition: A | Discharge status: Home / Self Care | Payer: Medicaid Other | Source: Ambulatory Visit | Attending: Obstetrics and Gynecology | Admitting: Obstetrics and Gynecology

## 2017-03-25 ENCOUNTER — Inpatient Hospital Stay (HOSPITAL_COMMUNITY): Payer: Medicaid Other | Admitting: Anesthesiology

## 2017-03-25 ENCOUNTER — Encounter (HOSPITAL_COMMUNITY): Payer: Self-pay | Admitting: *Deleted

## 2017-03-25 DIAGNOSIS — Z3A38 38 weeks gestation of pregnancy: Secondary | ICD-10-CM

## 2017-03-25 DIAGNOSIS — Z87891 Personal history of nicotine dependence: Secondary | ICD-10-CM | POA: Diagnosis not present

## 2017-03-25 DIAGNOSIS — O1494 Unspecified pre-eclampsia, complicating childbirth: Principal | ICD-10-CM | POA: Diagnosis present

## 2017-03-25 LAB — CBC
HCT: 36.6 % (ref 36.0–46.0)
Hemoglobin: 12.5 g/dL (ref 12.0–15.0)
MCH: 27.9 pg (ref 26.0–34.0)
MCHC: 34.2 g/dL (ref 30.0–36.0)
MCV: 81.7 fL (ref 78.0–100.0)
Platelets: 224 10*3/uL (ref 150–400)
RBC: 4.48 MIL/uL (ref 3.87–5.11)
RDW: 14.9 % (ref 11.5–15.5)
WBC: 8.3 10*3/uL (ref 4.0–10.5)

## 2017-03-25 LAB — TYPE AND SCREEN
ABO/RH(D): O POS
ANTIBODY SCREEN: NEGATIVE

## 2017-03-25 MED ORDER — LACTATED RINGERS IV SOLN
500.0000 mL | INTRAVENOUS | Status: DC | PRN
  Administered 2017-03-26: 200 mL via INTRAVENOUS

## 2017-03-25 MED ORDER — OXYTOCIN 40 UNITS IN LACTATED RINGERS INFUSION - SIMPLE MED: 2.5000 [IU]/h | INTRAVENOUS | Status: DC

## 2017-03-25 MED ORDER — ONDANSETRON HCL 4 MG/2ML IJ SOLN: 4.0000 mg | Freq: Four times a day (QID) | INTRAMUSCULAR | Status: DC | PRN

## 2017-03-25 MED ORDER — DIPHENHYDRAMINE HCL 50 MG/ML IJ SOLN: 12.5000 mg | INTRAMUSCULAR | Status: DC | PRN

## 2017-03-25 MED ORDER — OXYTOCIN BOLUS FROM INFUSION
500.0000 mL | Freq: Once | INTRAVENOUS | Status: AC
  Administered 2017-03-26: 500 mL via INTRAVENOUS

## 2017-03-25 MED ORDER — OXYCODONE-ACETAMINOPHEN 5-325 MG PO TABS: 1.0000 | ORAL_TABLET | ORAL | Status: DC | PRN

## 2017-03-25 MED ORDER — OXYTOCIN 40 UNITS IN LACTATED RINGERS INFUSION - SIMPLE MED
1.0000 m[IU]/min | INTRAVENOUS | Status: DC
  Administered 2017-03-25: 2 m[IU]/min via INTRAVENOUS
  Filled 2017-03-25: qty 1000

## 2017-03-25 MED ORDER — FENTANYL CITRATE (PF) 100 MCG/2ML IJ SOLN: 100.0000 ug | INTRAMUSCULAR | Status: DC | PRN

## 2017-03-25 MED ORDER — LIDOCAINE HCL (PF) 1 % IJ SOLN
INTRAMUSCULAR | Status: DC | PRN
  Administered 2017-03-25: 6 mL via EPIDURAL

## 2017-03-25 MED ORDER — EPHEDRINE 5 MG/ML INJ
10.0000 mg | INTRAVENOUS | Status: DC | PRN
  Filled 2017-03-25: qty 2

## 2017-03-25 MED ORDER — LACTATED RINGERS IV SOLN
INTRAVENOUS | Status: DC
  Administered 2017-03-25 – 2017-03-26 (×2): via INTRAVENOUS

## 2017-03-25 MED ORDER — ACETAMINOPHEN 325 MG PO TABS: 650.0000 mg | ORAL_TABLET | ORAL | Status: DC | PRN

## 2017-03-25 MED ORDER — LACTATED RINGERS IV SOLN
500.0000 mL | Freq: Once | INTRAVENOUS | Status: AC
  Administered 2017-03-25: 500 mL via INTRAVENOUS

## 2017-03-25 MED ORDER — TERBUTALINE SULFATE 1 MG/ML IJ SOLN
0.2500 mg | Freq: Once | INTRAMUSCULAR | Status: DC | PRN
  Filled 2017-03-25: qty 1

## 2017-03-25 MED ORDER — FENTANYL 2.5 MCG/ML BUPIVACAINE 1/10 % EPIDURAL INFUSION (WH - ANES)
14.0000 mL/h | INTRAMUSCULAR | Status: DC | PRN
  Administered 2017-03-25 – 2017-03-26 (×3): 14 mL/h via EPIDURAL
  Filled 2017-03-25 (×2): qty 100

## 2017-03-25 MED ORDER — SOD CITRATE-CITRIC ACID 500-334 MG/5ML PO SOLN: 30.0000 mL | ORAL | Status: DC | PRN

## 2017-03-25 MED ORDER — PHENYLEPHRINE 40 MCG/ML (10ML) SYRINGE FOR IV PUSH (FOR BLOOD PRESSURE SUPPORT)
80.0000 ug | PREFILLED_SYRINGE | INTRAVENOUS | Status: DC | PRN
  Filled 2017-03-25: qty 5
  Filled 2017-03-25: qty 10

## 2017-03-25 MED ORDER — LIDOCAINE HCL (PF) 1 % IJ SOLN
30.0000 mL | INTRAMUSCULAR | Status: DC | PRN
  Filled 2017-03-25: qty 30

## 2017-03-25 MED ORDER — OXYCODONE-ACETAMINOPHEN 5-325 MG PO TABS: 2.0000 | ORAL_TABLET | ORAL | Status: DC | PRN

## 2017-03-25 MED ORDER — PHENYLEPHRINE 40 MCG/ML (10ML) SYRINGE FOR IV PUSH (FOR BLOOD PRESSURE SUPPORT)
80.0000 ug | PREFILLED_SYRINGE | INTRAVENOUS | Status: DC | PRN
  Filled 2017-03-25: qty 5

## 2017-03-25 NOTE — Progress Notes (Signed)
Strip reviewed Cat 1 

## 2017-03-25 NOTE — H&P (Signed)
Paula Simmons is a 22 y.o. female presenting for induction of labor for preeclampsia. Attempted AROM with amnihook. No fluid seen. Will continue to observe for fluid. Pitocin started per orders. Planning epidural OB History    Gravida Para Term Preterm AB Living   3 1 1   1 1    SAB TAB Ectopic Multiple Live Births   1 0 0 0 1     Past Medical History:  Diagnosis Date  . History of postpartum hemorrhage, currently pregnant   . Hx of pyelonephritis during pregnancy   . Hx of pyelonephritis during pregnancy   . Medical history non-contributory   . Vaginal Pap smear, abnormal    Past Surgical History:  Procedure Laterality Date  . arm surgery      Family History: family history includes Diabetes in her maternal grandmother; Hypertension in her maternal grandmother. Social History:  reports that she has quit smoking. she has never used smokeless tobacco. She reports that she does not drink alcohol or use drugs.     Maternal Diabetes: No Genetic Screening: Declined Maternal Ultrasounds/Referrals: Normal Fetal Ultrasounds or other Referrals:  None Maternal Substance Abuse:  No Significant Maternal Medications:  None Significant Maternal Lab Results:  Lab values include: Group B Strep negative Other Comments:  None  Review of Systems  All other systems reviewed and are negative.  Maternal Medical History:  Fetal activity: Perceived fetal activity is normal.   Last perceived fetal movement was within the past hour.    Prenatal complications: Pre-eclampsia.   Prenatal Complications - Diabetes: none.    Dilation: 3.5 Effacement (%): 70 Station: -1, -2 Exam by:: [employer] PNC FinancialLori Lakota Markgraf, CNM Blood pressure (!) 134/93, pulse 95, temperature 97.7 F (36.5 C), temperature source Oral, height 5\' 3"  (1.6 m), weight 206 lb (93.4 kg), unknown if currently breastfeeding. Maternal Exam:  Uterine Assessment: Contraction frequency is rare.   Abdomen: Patient reports no abdominal tenderness.  Estimated fetal weight is 7.5.   Fetal presentation: vertex  Introitus: Normal vulva. Normal vagina.  Pelvis: adequate for delivery.   Cervix: Cervix evaluated by digital exam.     Physical Exam  Nursing note and vitals reviewed. Constitutional: She is oriented to person, place, and time. She appears well-developed and well-nourished.  HENT:  Head: Normocephalic and atraumatic.  Eyes: Pupils are equal, round, and reactive to light.  Neck: Normal range of motion.  Cardiovascular: Normal rate.  Respiratory: Effort normal. No respiratory distress.  GI: Soft. There is no tenderness.  Musculoskeletal: Normal range of motion.  Neurological: She is alert and oriented to person, place, and time.  Skin: Skin is warm and dry.  Psychiatric: She has a normal mood and affect. Her behavior is normal.    Prenatal labs: ABO, Rh: O/Positive/-- (08/10 0000) Antibody: Negative (08/10 0000) Rubella: Immune (08/10 0000) RPR: Nonreactive (08/10 0000)  HBsAg: Negative (08/10 0000)  HIV: Non-reactive (08/10 0000)  GBS: Negative (01/17 0000)   FHR Cat 1 Uc- None SVE 3-4/70/-1/-2  Assessment/Plan: Induction AROM/Pitocin Routine L/D orders Epidural when desired Anticipate Vaginal Delivery  Camerin Jimenez A Judithann Villamar CNM 03/25/2017, 7:33 PM

## 2017-03-25 NOTE — Telephone Encounter (Signed)
Preadmission screen  

## 2017-03-25 NOTE — Anesthesia Preprocedure Evaluation (Signed)
Anesthesia Evaluation  Patient identified by MRN, date of birth, ID band Patient awake    Airway Mallampati: II  TM Distance: >3 FB Neck ROM: Full    Dental no notable dental hx.    Pulmonary neg pulmonary ROS, former smoker,    Pulmonary exam normal breath sounds clear to auscultation       Cardiovascular hypertension, Normal cardiovascular exam Rhythm:Regular Rate:Normal     Neuro/Psych    GI/Hepatic   Endo/Other    Renal/GU      Musculoskeletal   Abdominal   Peds  Hematology   Anesthesia Other Findings   Reproductive/Obstetrics (+) Pregnancy                             Anesthesia Physical Anesthesia Plan  ASA: III  Anesthesia Plan: Epidural   Post-op Pain Management:    Induction:   PONV Risk Score and Plan:   Airway Management Planned: Natural Airway  Additional Equipment:   Intra-op Plan:   Post-operative Plan:   Informed Consent: I have reviewed the patients History and Physical, chart, labs and discussed the procedure including the risks, benefits and alternatives for the proposed anesthesia with the patient or authorized representative who has indicated his/her understanding and acceptance.     Plan Discussed with: CRNA  Anesthesia Plan Comments:         Anesthesia Quick Evaluation

## 2017-03-25 NOTE — Progress Notes (Signed)
Strip reviewed Category 1

## 2017-03-25 NOTE — Anesthesia Procedure Notes (Signed)
Epidural Patient location during procedure: OB Start time: 03/25/2017 11:47 PM End time: 03/25/2017 11:56 PM  Staffing Anesthesiologist: Trevor IhaHouser, Stephen A, MD Performed: anesthesiologist   Preanesthetic Checklist Completed: patient identified, site marked, surgical consent, pre-op evaluation, timeout performed, IV checked, risks and benefits discussed and monitors and equipment checked  Epidural Patient position: sitting Prep: site prepped and draped and DuraPrep Patient monitoring: continuous pulse ox and blood pressure Approach: midline Location: L3-L4 Injection technique: LOR air  Needle:  Needle type: Tuohy  Needle gauge: 17 G Needle length: 9 cm and 9 Needle insertion depth: 5 cm cm Catheter type: closed end flexible Catheter size: 19 Gauge Catheter at skin depth: 10 cm Test dose: negative  Assessment Events: blood not aspirated, injection not painful, no injection resistance, negative IV test and no paresthesia

## 2017-03-25 NOTE — Anesthesia Pain Management Evaluation Note (Signed)
  CRNA Pain Management Visit Note  Patient: Paula OverlieLorena Garay, 22 y.o., female  "Hello I am a member of the anesthesia team at [facility]. We have an anesthesia team available at all times to provide care throughout the hospital, including epidural management and anesthesia for C-section. I don't know your plan for the delivery whether it a natural birth, water birth, IV sedation, nitrous supplementation, doula or epidural, but we want to meet your pain goals."   1.Was your pain managed to your expectations on prior hospitalizations?   Yes   2.What is your expectation for pain management during this hospitalization?     Epidural  3.How can we help you reach that goal? Epidural when desired.  Record the patient's initial score and the patient's pain goal.   Pain: 6  Pain Goal: 8 [facility] wants you to be able to say your pain was always managed very well.  Glynis Hunsucker 03/25/2017

## 2017-03-26 ENCOUNTER — Encounter (HOSPITAL_COMMUNITY): Payer: Self-pay | Admitting: *Deleted

## 2017-03-26 LAB — RPR: RPR: NONREACTIVE

## 2017-03-26 MED ORDER — EPHEDRINE 5 MG/ML INJ: 10.0000 mg | INTRAVENOUS | Status: DC | PRN

## 2017-03-26 MED ORDER — MISOPROSTOL 200 MCG PO TABS
1000.0000 ug | ORAL_TABLET | Freq: Once | ORAL | Status: AC
  Administered 2017-03-26: 1000 ug via RECTAL

## 2017-03-26 MED ORDER — MISOPROSTOL 200 MCG PO TABS
ORAL_TABLET | ORAL | Status: AC
  Filled 2017-03-26: qty 5

## 2017-03-26 MED ORDER — ACETAMINOPHEN 325 MG PO TABS: 650.0000 mg | ORAL_TABLET | ORAL | Status: DC | PRN

## 2017-03-26 MED ORDER — TETANUS-DIPHTH-ACELL PERTUSSIS 5-2.5-18.5 LF-MCG/0.5 IM SUSP: 0.5000 mL | Freq: Once | INTRAMUSCULAR | Status: DC

## 2017-03-26 MED ORDER — PHENYLEPHRINE 40 MCG/ML (10ML) SYRINGE FOR IV PUSH (FOR BLOOD PRESSURE SUPPORT): 80.0000 ug | PREFILLED_SYRINGE | INTRAVENOUS | Status: DC | PRN

## 2017-03-26 MED ORDER — BENZOCAINE-MENTHOL 20-0.5 % EX AERO
1.0000 "application " | INHALATION_SPRAY | CUTANEOUS | Status: DC | PRN
  Administered 2017-03-26: 1 via TOPICAL
  Filled 2017-03-26: qty 56

## 2017-03-26 MED ORDER — SENNOSIDES-DOCUSATE SODIUM 8.6-50 MG PO TABS
2.0000 | ORAL_TABLET | ORAL | Status: DC
  Administered 2017-03-26: 2 via ORAL
  Filled 2017-03-26: qty 2

## 2017-03-26 MED ORDER — IBUPROFEN 600 MG PO TABS
600.0000 mg | ORAL_TABLET | Freq: Four times a day (QID) | ORAL | Status: DC
  Administered 2017-03-26 – 2017-03-27 (×4): 600 mg via ORAL
  Filled 2017-03-26 (×4): qty 1

## 2017-03-26 MED ORDER — ONDANSETRON HCL 4 MG/2ML IJ SOLN: 4.0000 mg | INTRAMUSCULAR | Status: DC | PRN

## 2017-03-26 MED ORDER — LACTATED RINGERS IV SOLN: 500.0000 mL | Freq: Once | INTRAVENOUS | Status: DC

## 2017-03-26 MED ORDER — HYDRALAZINE HCL 20 MG/ML IJ SOLN: 10.0000 mg | Freq: Once | INTRAMUSCULAR | Status: DC | PRN

## 2017-03-26 MED ORDER — COCONUT OIL OIL: 1.0000 "application " | TOPICAL_OIL | Status: DC | PRN

## 2017-03-26 MED ORDER — LABETALOL HCL 5 MG/ML IV SOLN: 20.0000 mg | INTRAVENOUS | Status: DC | PRN

## 2017-03-26 MED ORDER — ZOLPIDEM TARTRATE 5 MG PO TABS: 5.0000 mg | ORAL_TABLET | Freq: Every evening | ORAL | Status: DC | PRN

## 2017-03-26 MED ORDER — FERROUS SULFATE 325 (65 FE) MG PO TABS
325.0000 mg | ORAL_TABLET | Freq: Two times a day (BID) | ORAL | Status: DC
  Administered 2017-03-26 – 2017-03-27 (×2): 325 mg via ORAL
  Filled 2017-03-26 (×2): qty 1

## 2017-03-26 MED ORDER — LACTATED RINGERS IV SOLN
INTRAVENOUS | Status: DC
  Administered 2017-03-26: 07:00:00 via INTRAUTERINE

## 2017-03-26 MED ORDER — ONDANSETRON HCL 4 MG PO TABS: 4.0000 mg | ORAL_TABLET | ORAL | Status: DC | PRN

## 2017-03-26 MED ORDER — OXYCODONE-ACETAMINOPHEN 5-325 MG PO TABS
2.0000 | ORAL_TABLET | ORAL | Status: DC | PRN
  Administered 2017-03-26: 2 via ORAL
  Filled 2017-03-26: qty 2

## 2017-03-26 MED ORDER — DIPHENHYDRAMINE HCL 25 MG PO CAPS: 25.0000 mg | ORAL_CAPSULE | Freq: Four times a day (QID) | ORAL | Status: DC | PRN

## 2017-03-26 MED ORDER — MEASLES, MUMPS & RUBELLA VAC ~~LOC~~ INJ
0.5000 mL | INJECTION | Freq: Once | SUBCUTANEOUS | Status: DC
  Filled 2017-03-26: qty 0.5

## 2017-03-26 MED ORDER — WITCH HAZEL-GLYCERIN EX PADS: 1.0000 "application " | MEDICATED_PAD | CUTANEOUS | Status: DC | PRN

## 2017-03-26 MED ORDER — SIMETHICONE 80 MG PO CHEW: 80.0000 mg | CHEWABLE_TABLET | ORAL | Status: DC | PRN

## 2017-03-26 MED ORDER — DIBUCAINE 1 % RE OINT: 1.0000 "application " | TOPICAL_OINTMENT | RECTAL | Status: DC | PRN

## 2017-03-26 MED ORDER — OXYCODONE-ACETAMINOPHEN 5-325 MG PO TABS
1.0000 | ORAL_TABLET | ORAL | Status: DC | PRN
  Administered 2017-03-26 – 2017-03-27 (×2): 1 via ORAL
  Filled 2017-03-26 (×2): qty 1

## 2017-03-26 MED ORDER — PRENATAL MULTIVITAMIN CH
1.0000 | ORAL_TABLET | Freq: Every day | ORAL | Status: DC
  Administered 2017-03-26: 1 via ORAL
  Filled 2017-03-26: qty 1

## 2017-03-26 NOTE — Progress Notes (Signed)
S: Comfortable with epidural. No fluid seen since previous attempt to AROM. AROM wioth moderate clear fluid O: B/P's stable 108/70-140/94      FHR CAt 1      uc's q 3 minutes      SVE 4-5/70/-1/-2 A: Induction for Preeclampsia @ 38+5 P: Continue to titrate pitocin for adequate labor      Anticipate vaginal delivery  Illene BolusLori Clemmons CNM

## 2017-03-26 NOTE — Anesthesia Postprocedure Evaluation (Signed)
Anesthesia Post Note  Patient: Paula Simmons  Procedure(s) Performed: AN AD HOC LABOR EPIDURAL     Patient location during evaluation: Mother Baby Anesthesia Type: Epidural Level of consciousness: awake and alert and oriented Pain management: satisfactory to patient Vital Signs Assessment: post-procedure vital signs reviewed and stable Respiratory status: spontaneous breathing and nonlabored ventilation Cardiovascular status: stable Postop Assessment: no headache, no backache, no signs of nausea or vomiting, adequate PO intake and patient able to bend at knees (patient up walking) Anesthetic complications: no    Last Vitals:  Vitals:   03/26/17 1108 03/26/17 1250  BP: 117/87 119/83  Pulse: 91 85  Resp: 18 16  Temp: 37.1 C 36.8 C  SpO2:      Last Pain:  Vitals:   03/26/17 1250  TempSrc: Oral  PainSc:    Pain Goal: Patients Stated Pain Goal: 6 (03/25/17 2136)               Madison HickmanGREGORY,Aiyanna Awtrey

## 2017-03-26 NOTE — Progress Notes (Signed)
Pt ambulating well, fundus midline, firm and one below with small bleeding. No clots!  Foley removed at 2000 per MD orders. Pt instructed to empty bladder every 2-3hrs  to allow her uterus to contract effectively and decrease the the risk of PPH. Pt verbalized understanding.

## 2017-03-26 NOTE — Progress Notes (Signed)
S: Pt comfortable, RN called states pt was having some variables with contractions O: VE 5/90/-2      Uc's q 2      Pit on 2818mu/min  A: Active labor      IUPC placed and amnioinfusion ordered.      Sign out to Dr Estanislado Pandyivard at [employer]7am  Kiyanna Biegler CNM

## 2017-03-27 LAB — COMPREHENSIVE METABOLIC PANEL
ALK PHOS: 138 U/L — AB (ref 38–126)
ALT: 11 U/L — AB (ref 14–54)
AST: 20 U/L (ref 15–41)
Albumin: 2.2 g/dL — ABNORMAL LOW (ref 3.5–5.0)
Anion gap: 7 (ref 5–15)
BILIRUBIN TOTAL: 0.6 mg/dL (ref 0.3–1.2)
BUN: 10 mg/dL (ref 6–20)
CALCIUM: 8.4 mg/dL — AB (ref 8.9–10.3)
CHLORIDE: 103 mmol/L (ref 101–111)
CO2: 23 mmol/L (ref 22–32)
CREATININE: 0.61 mg/dL (ref 0.44–1.00)
Glucose, Bld: 102 mg/dL — ABNORMAL HIGH (ref 65–99)
Potassium: 4 mmol/L (ref 3.5–5.1)
Sodium: 133 mmol/L — ABNORMAL LOW (ref 135–145)
Total Protein: 5.3 g/dL — ABNORMAL LOW (ref 6.5–8.1)

## 2017-03-27 LAB — CBC
HEMATOCRIT: 29.3 % — AB (ref 36.0–46.0)
HEMOGLOBIN: 10.1 g/dL — AB (ref 12.0–15.0)
MCH: 28.3 pg (ref 26.0–34.0)
MCHC: 34.5 g/dL (ref 30.0–36.0)
MCV: 82.1 fL (ref 78.0–100.0)
Platelets: 193 10*3/uL (ref 150–400)
RBC: 3.57 MIL/uL — AB (ref 3.87–5.11)
RDW: 15.1 % (ref 11.5–15.5)
WBC: 10.9 10*3/uL — AB (ref 4.0–10.5)

## 2017-03-27 MED ORDER — IBUPROFEN 600 MG PO TABS: 600.0000 mg | ORAL_TABLET | Freq: Four times a day (QID) | ORAL | 0 refills | Status: AC

## 2017-03-27 MED ORDER — OXYCODONE-ACETAMINOPHEN 5-325 MG PO TABS: 1.0000 | ORAL_TABLET | ORAL | 0 refills | Status: AC | PRN

## 2017-03-27 NOTE — Lactation Note (Signed)
This note was copied from a baby's chart. Lactation Consultation Note Baby 21 hrs old. Mom stated BF going well. Mom is going to BF and formula feed. Encouraged mom to BF before giving formula, and also try to not formula feed for a couple of weeks to build milk supply. Mom attempted to BF her daughter now 2716 months old for a couple of days. Mom stated she wasn't patient enough, really wants to try it now.  Reviewed importance of I&O, STS. Mom encouraged to feed baby 8-12 times/24 hours and with feeding cues.  Mom stated the baby slept 6 hrs and 5 hr stretch. Encouraged to wake baby up in 3 hrs if hasn't woke up on their own.  Encouraged to assess breast before and after feedings for transfer. Encouraged to call for assistance. WH/LC brochure given w/resources, support groups and LC services.  Patient Name: Paula Simmons [id number]'s Date: 03/27/2017 Reason for consult: Initial assessment   Maternal Data Has patient been taught Hand Expression?: Yes Does the patient have breastfeeding experience prior to this delivery?: No  Feeding Feeding Type: Breast Fed Length of feed: 15 min  LATCH Score       Type of Nipple: Everted at rest and after stimulation  Comfort (Breast/Nipple): Soft / non-tender  Hold (Positioning): No assistance needed to correctly position infant at breast.     Interventions Interventions: Breast feeding basics reviewed  Lactation Tools Discussed/Used WIC Program: No   Consult Status Consult Status: Follow-up Date: 03/28/17 Follow-up type: In-patient    Charyl DancerCARVER, Ladaysha Soutar G 03/27/2017, 6:17 AM

## 2017-03-27 NOTE — Discharge Instructions (Signed)
Iron-Rich Diet Iron is a mineral that helps your body to produce hemoglobin. Hemoglobin is a protein in your red blood cells that carries oxygen to your body's tissues. Eating too little iron may cause you to feel weak and tired, and it can increase your risk for infection. Eating enough iron is necessary for your body's metabolism, muscle function, and nervous system. Iron is naturally found in many foods. It can also be added to foods or fortified in foods. There are two types of dietary iron:  Heme iron. Heme iron is absorbed by the body more easily than nonheme iron. Heme iron is found in meat, poultry, and fish.  Nonheme iron. Nonheme iron is found in dietary supplements, iron-fortified grains, beans, and vegetables.  You may need to follow an iron-rich diet if:  You have been diagnosed with iron deficiency or iron-deficiency anemia.  You have a condition that prevents you from absorbing dietary iron, such as: ? Infection in your intestines. ? Celiac disease. This involves long-lasting (chronic) inflammation of your intestines.  You do not eat enough iron.  You eat a diet that is high in foods that impair iron absorption.  You have lost a lot of blood.  You have heavy bleeding during your menstrual cycle.  You are pregnant.  What is my plan? Your health care provider may help you to determine how much iron you need per day based on your condition. Generally, when a person consumes sufficient amounts of iron in the diet, the following iron needs are met:  Men. ? 66-61 years old: 11 mg per day. ? 16-18 years old: 8 mg per day.  Women. ? 50-5 years old: 15 mg per day. ? 47-2 years old: 18 mg per day. ? Over 43 years old: 8 mg per day. ? Pregnant women: 27 mg per day. ? Breastfeeding women: 9 mg per day.  What do I need to know about an iron-rich diet?  Eat fresh fruits and vegetables that are high in vitamin C along with foods that are high in iron. This will help  increase the amount of iron that your body absorbs from food, especially with foods containing nonheme iron. Foods that are high in vitamin C include oranges, peppers, tomatoes, and mango.  Take iron supplements only as directed by your health care provider. Overdose of iron can be life-threatening. If you were prescribed iron supplements, take them with orange juice or a vitamin C supplement.  Cook foods in pots and pans that are made from iron.  Eat nonheme iron-containing foods alongside foods that are high in heme iron. This helps to improve your iron absorption.  Certain foods and drinks contain compounds that impair iron absorption. Avoid eating these foods in the same meal as iron-rich foods or with iron supplements. These include: ? Coffee, black tea, and red wine. ? Milk, dairy products, and foods that are high in calcium. ? Beans, soybeans, and peas. ? Whole grains.  When eating foods that contain both nonheme iron and compounds that impair iron absorption, follow these tips to absorb iron better. ? Soak beans overnight before cooking. ? Soak whole grains overnight and drain them before using. ? Ferment flours before baking, such as using yeast in bread dough. What foods can I eat? Grains Iron-fortified breakfast cereal. Iron-fortified whole-wheat bread. Enriched rice. Sprouted grains. Vegetables Spinach. Potatoes with skin. Green peas. Broccoli. Red and green bell peppers. Fermented vegetables. Fruits Prunes. Raisins. Oranges. Strawberries. Mango. Grapefruit. Meats and Other Protein Sources  Beef liver. Oysters. Beef. Shrimp. [place]. Chicken. [place]. Sardines. Chickpeas. Nuts. Tofu. Beverages Tomato juice. Fresh orange juice. Prune juice. Hibiscus tea. Fortified instant breakfast shakes. Condiments Tahini. Fermented soy sauce. Sweets and Desserts Black-strap molasses. Other Wheat germ. The items listed above may not be a complete list of recommended foods or beverages.  Contact your dietitian for more options. What foods are not recommended? Grains Whole grains. Bran cereal. Bran flour. Oats. Vegetables Artichokes. Brussels sprouts. Kale. Fruits Blueberries. Raspberries. Strawberries. Figs. Meats and Other Protein Sources Soybeans. Products made from soy protein. Dairy Milk. Cream. Cheese. Yogurt. Cottage cheese. Beverages Coffee. Black tea. Red wine. Sweets and Desserts Cocoa. Chocolate. Ice cream. Other Basil. Oregano. Parsley. The items listed above may not be a complete list of foods and beverages to avoid. Contact your dietitian for more information. This information is not intended to replace advice given to you by your health care provider. Make sure you discuss any questions you have with your health care provider. Document Released: 09/11/2004 Document Revised: 08/18/2015 Document Reviewed: 08/25/2013 Elsevier Interactive Patient Education  2018 [place] is this medicine? ETONOGESTREL (et oh noe JES trel) is a contraceptive (birth control) device. It is used to prevent pregnancy. It can be used for up to 3 years. This medicine may be used for other purposes; ask your health care provider or pharmacist if you have questions. COMMON BRAND NAME(S): Implanon, Nexplanon What should I tell my health care provider before I take this medicine? They need to know if you have any of these conditions: -abnormal vaginal bleeding -blood vessel disease or blood clots -cancer of the breast, cervix, or liver -depression -diabetes -gallbladder disease -headaches -heart disease or recent heart attack -high blood pressure -high cholesterol -kidney disease -liver disease -renal disease -seizures -tobacco smoker -an unusual or allergic reaction to etonogestrel, other hormones, anesthetics or antiseptics, medicines, foods, dyes, or preservatives -pregnant or trying to get pregnant -breast-feeding How should I use this  medicine? This device is inserted just under the skin on the inner side of your upper arm by a health care professional. Talk to your pediatrician regarding the use of this medicine in children. Special care may be needed. Overdosage: If you think you have taken too much of this medicine contact a poison control center or emergency room at once. NOTE: This medicine is only for you. Do not share this medicine with others. What if I miss a dose? This does not apply. What may interact with this medicine? Do not take this medicine with any of the following medications: -amprenavir -bosentan -fosamprenavir This medicine may also interact with the following medications: -barbiturate medicines for inducing sleep or treating seizures -certain medicines for fungal infections like ketoconazole and itraconazole -grapefruit juice -griseofulvin -medicines to treat seizures like carbamazepine, felbamate, oxcarbazepine, phenytoin, topiramate -modafinil -phenylbutazone -rifampin -rufinamide -some medicines to treat HIV infection like atazanavir, indinavir, lopinavir, nelfinavir, tipranavir, ritonavir -St. John's wort This list may not describe all possible interactions. Give your health care provider a list of all the medicines, herbs, non-prescription drugs, or dietary supplements you use. Also tell them if you smoke, drink alcohol, or use illegal drugs. Some items may interact with your medicine. What should I watch for while using this medicine? This product does not protect you against HIV infection (AIDS) or other sexually transmitted diseases. You should be able to feel the implant by pressing your fingertips over the skin where it was inserted. Contact your doctor if you cannot feel the  implant, and use a non-hormonal birth control method (such as condoms) until your doctor confirms that the implant is in place. If you feel that the implant may have broken or become bent while in your arm, contact  your healthcare provider. What side effects may I notice from receiving this medicine? Side effects that you should report to your doctor or health care professional as soon as possible: -allergic reactions like skin rash, itching or hives, swelling of the face, lips, or tongue -breast lumps -changes in emotions or moods -depressed mood -heavy or prolonged menstrual bleeding -pain, irritation, swelling, or bruising at the insertion site -scar at site of insertion -signs of infection at the insertion site such as fever, and skin redness, pain or discharge -signs of pregnancy -signs and symptoms of a blood clot such as breathing problems; changes in vision; chest pain; severe, sudden headache; pain, swelling, warmth in the leg; trouble speaking; sudden numbness or weakness of the face, arm or leg -signs and symptoms of liver injury like dark yellow or brown urine; general ill feeling or flu-like symptoms; light-colored stools; loss of appetite; nausea; right upper belly pain; unusually weak or tired; yellowing of the eyes or skin -unusual vaginal bleeding, discharge -signs and symptoms of a stroke like changes in vision; confusion; trouble speaking or understanding; severe headaches; sudden numbness or weakness of the face, arm or leg; trouble walking; dizziness; loss of balance or coordination Side effects that usually do not require medical attention (report to your doctor or health care professional if they continue or are bothersome): -acne -back pain -breast pain -changes in weight -dizziness -general ill feeling or flu-like symptoms -headache -irregular menstrual bleeding -nausea -sore throat -vaginal irritation or inflammation This list may not describe all possible side effects. Call your doctor for medical advice about side effects. You may report side effects to FDA at 1-800-FDA-1088. Where should I keep my medicine? This drug is given in a hospital or clinic and will not be  stored at home. NOTE: This sheet is a summary. It may not cover all possible information. If you have questions about this medicine, talk to your doctor, pharmacist, or health care provider.  2018 Elsevier/Gold Standard (2015-08-17 11:19:22)

## 2017-03-27 NOTE — Discharge Summary (Signed)
OB Discharge Summary     Patient Name: Paula Simmons DOB: 06/16/1995 MRN: [id number]  Date of admission: 03/25/2017 Delivering MD: Silverio Lay   Date of discharge: 03/27/2017  Admitting diagnosis: 38wks Induction Intrauterine pregnancy: [redacted]w[redacted]d     Secondary diagnosis:  Active Problems:   Pre-eclampsia affecting childbirth  Additional problems: None     Discharge diagnosis: Term Pregnancy Delivered                                                                                                Post partum procedures:None  Augmentation: Pitocin  Complications: None  Hospital course:  Onset of Labor With Vaginal Delivery     22 y.o. yo [id number] at [redacted]w[redacted]d was admitted in Active Labor on 03/25/2017. Patient had an uncomplicated labor course as follows:  Membrane Rupture Time/Date: 7:25 PM ,03/25/2017   Intrapartum Procedures: Episiotomy: None [1]                                         Lacerations:  2nd degree [3];Perineal [11]  Patient had a delivery of a Viable infant. 03/26/2017  Information for the patient's newborn:  Syniah, [place] [[id number]]  Delivery Method: Vaginal, Spontaneous(Filed from Delivery Summary)    Pateint had an uncomplicated postpartum course.  She is ambulating, tolerating a regular diet, passing flatus, and urinating well. Patient is discharged home in stable condition on 03/27/17.   Physical exam  Vitals:   03/26/17 1250 03/26/17 1637 03/26/17 1957 03/26/17 2322  BP: 119/83 125/77 114/71 116/75  Pulse: 85 82 88 84  Resp: 16 18 18 18   Temp: 98.3 F (36.8 C) 98.5 F (36.9 C) 98.3 F (36.8 C) 98.2 F (36.8 C)  TempSrc: Oral Oral Oral Oral  SpO2:  99%    Weight:      Height:       General: alert, cooperative and no distress Lochia: appropriate Uterine Fundus: firm Incision: N/A DVT Evaluation: No evidence of DVT seen on physical exam. Labs: Lab Results  Component Value Date   WBC 10.9 (H) 03/27/2017   HGB 10.1 (L) 03/27/2017   HCT 29.3  (L) 03/27/2017   MCV 82.1 03/27/2017   PLT 193 03/27/2017   CMP Latest Ref Rng & Units 03/27/2017  Glucose 65 - 99 mg/dL 696(E)  BUN 6 - 20 mg/dL 10  Creatinine 9.52 - 8.41 mg/dL 3.24  Sodium 401 - 027 mmol/L 133(L)  Potassium 3.5 - 5.1 mmol/L 4.0  Chloride 101 - 111 mmol/L 103  CO2 22 - 32 mmol/L 23  Calcium 8.9 - 10.3 mg/dL 2.5(D)  Total Protein 6.5 - 8.1 g/dL 5.3(L)  Total Bilirubin 0.3 - 1.2 mg/dL 0.6  Alkaline Phos 38 - 126 U/L 138(H)  AST 15 - 41 U/L 20  ALT 14 - 54 U/L 11(L)    Discharge instruction: per After Visit Summary and "Baby and Me Booklet". Pain Management, Peri-Care, Breastfeeding, Who and When to call for postpartum complications. Information Sheet(s) given Nexplanon, Iron Rich Diet  After visit meds:  Allergies as of 03/27/2017   No Known Allergies     Medication List    TAKE these medications   ferrous sulfate 325 (65 FE) MG tablet Take 1 tablet (325 mg total) by mouth 2 (two) times daily with a meal.   ibuprofen 600 MG tablet Commonly known as:  ADVIL,MOTRIN Take 1 tablet (600 mg total) by mouth every 6 (six) hours.   oxyCODONE-acetaminophen 5-325 MG tablet Commonly known as:  PERCOCET/ROXICET Take 1 tablet by mouth every 4 (four) hours as needed (pain scale 4-7).   prenatal multivitamin Tabs tablet Take 1 tablet by mouth daily.       Diet: routine diet  Activity: Advance as tolerated. Pelvic rest for 6 weeks.   Outpatient follow up:6 weeks Follow up Appt:No future appointments. Follow up Visit:No Follow-up on file.  Postpartum contraception: Nexplanon  Newborn Data: Live born female  Birth Weight: 8 lb 14.2 oz (4030 g) APGAR: 8, 9  Newborn Delivery   Birth date/time:  03/26/2017 08:40:00 Delivery type:  Vaginal, Spontaneous     Baby Feeding: Breast Disposition:home with mother   03/27/2017 Cherre RobinsJessica L Lawrie Tunks, CNM

## 2017-03-29 ENCOUNTER — Inpatient Hospital Stay (HOSPITAL_COMMUNITY): Payer: Medicaid Other

## 2020-08-09 ENCOUNTER — Ambulatory Visit: Payer: Medicaid Other | Admitting: Physical Therapy

## 2020-08-15 ENCOUNTER — Ambulatory Visit: Payer: Medicaid Other | Attending: Obstetrics and Gynecology | Admitting: Physical Therapy
